# Patient Record
Sex: Male | Born: 1935 | Race: Black or African American | Hispanic: No | Smoking: Former smoker
Health system: Southern US, Community
[De-identification: ages and names within clinical notes are randomized; demographics above are authoritative.]

## PROBLEM LIST (undated history)

## (undated) DIAGNOSIS — E119 Type 2 diabetes mellitus without complications: Secondary | ICD-10-CM

## (undated) DIAGNOSIS — Z9889 Other specified postprocedural states: Secondary | ICD-10-CM

## (undated) DIAGNOSIS — K269 Duodenal ulcer, unspecified as acute or chronic, without hemorrhage or perforation: Secondary | ICD-10-CM

## (undated) HISTORY — DX: Other specified postprocedural states: Z98.890

## (undated) HISTORY — DX: Duodenal ulcer, unspecified as acute or chronic, without hemorrhage or perforation: K26.9

## (undated) HISTORY — DX: Type 2 diabetes mellitus without complications: E11.9

---

## 1994-02-27 DIAGNOSIS — Z9889 Other specified postprocedural states: Secondary | ICD-10-CM

## 1994-02-27 HISTORY — DX: Other specified postprocedural states: Z98.890

## 1998-05-20 ENCOUNTER — Encounter: Admission: RE | Admit: 1998-05-20 | Discharge: 1998-05-20 | Payer: Self-pay | Admitting: Internal Medicine

## 1998-10-15 ENCOUNTER — Encounter: Admission: RE | Admit: 1998-10-15 | Discharge: 1998-10-15 | Payer: Self-pay | Admitting: Internal Medicine

## 1998-12-03 ENCOUNTER — Encounter: Admission: RE | Admit: 1998-12-03 | Discharge: 1998-12-03 | Payer: Self-pay | Admitting: Internal Medicine

## 1999-01-13 ENCOUNTER — Other Ambulatory Visit: Admission: RE | Admit: 1999-01-13 | Discharge: 1999-01-13 | Payer: Self-pay | Admitting: Urology

## 1999-03-24 ENCOUNTER — Encounter: Admission: RE | Admit: 1999-03-24 | Discharge: 1999-03-24 | Payer: Self-pay | Admitting: Internal Medicine

## 1999-07-07 ENCOUNTER — Encounter: Admission: RE | Admit: 1999-07-07 | Discharge: 1999-07-07 | Payer: Self-pay | Admitting: Internal Medicine

## 1999-07-20 ENCOUNTER — Other Ambulatory Visit: Admission: RE | Admit: 1999-07-20 | Discharge: 1999-07-20 | Payer: Self-pay | Admitting: Urology

## 1999-07-20 ENCOUNTER — Encounter (INDEPENDENT_AMBULATORY_CARE_PROVIDER_SITE_OTHER): Payer: Self-pay | Admitting: Specialist

## 1999-08-05 ENCOUNTER — Encounter: Admission: RE | Admit: 1999-08-05 | Discharge: 1999-08-05 | Payer: Self-pay | Admitting: Internal Medicine

## 1999-09-08 ENCOUNTER — Encounter: Admission: RE | Admit: 1999-09-08 | Discharge: 1999-09-08 | Payer: Self-pay | Admitting: Internal Medicine

## 1999-12-08 ENCOUNTER — Encounter: Admission: RE | Admit: 1999-12-08 | Discharge: 1999-12-08 | Payer: Self-pay | Admitting: Internal Medicine

## 2000-02-23 ENCOUNTER — Encounter: Admission: RE | Admit: 2000-02-23 | Discharge: 2000-02-23 | Payer: Self-pay | Admitting: Internal Medicine

## 2000-04-26 ENCOUNTER — Encounter: Admission: RE | Admit: 2000-04-26 | Discharge: 2000-04-26 | Payer: Self-pay | Admitting: Internal Medicine

## 2000-06-07 ENCOUNTER — Encounter: Admission: RE | Admit: 2000-06-07 | Discharge: 2000-06-07 | Payer: Self-pay | Admitting: Internal Medicine

## 2000-06-08 ENCOUNTER — Encounter: Admission: RE | Admit: 2000-06-08 | Discharge: 2000-06-08 | Payer: Self-pay | Admitting: Internal Medicine

## 2000-09-13 ENCOUNTER — Encounter: Admission: RE | Admit: 2000-09-13 | Discharge: 2000-09-13 | Payer: Self-pay | Admitting: Internal Medicine

## 2000-12-06 ENCOUNTER — Encounter: Admission: RE | Admit: 2000-12-06 | Discharge: 2000-12-06 | Payer: Self-pay | Admitting: Internal Medicine

## 2001-02-28 ENCOUNTER — Encounter: Admission: RE | Admit: 2001-02-28 | Discharge: 2001-02-28 | Payer: Self-pay | Admitting: Internal Medicine

## 2001-05-16 ENCOUNTER — Encounter: Admission: RE | Admit: 2001-05-16 | Discharge: 2001-05-16 | Payer: Self-pay | Admitting: Internal Medicine

## 2001-09-12 ENCOUNTER — Encounter: Admission: RE | Admit: 2001-09-12 | Discharge: 2001-09-12 | Payer: Self-pay | Admitting: Internal Medicine

## 2002-02-13 ENCOUNTER — Encounter: Admission: RE | Admit: 2002-02-13 | Discharge: 2002-02-13 | Payer: Self-pay | Admitting: Internal Medicine

## 2002-05-02 ENCOUNTER — Ambulatory Visit (HOSPITAL_COMMUNITY): Admission: RE | Admit: 2002-05-02 | Discharge: 2002-05-02 | Payer: Self-pay | Admitting: Gastroenterology

## 2002-05-15 ENCOUNTER — Encounter: Admission: RE | Admit: 2002-05-15 | Discharge: 2002-05-15 | Payer: Self-pay | Admitting: Internal Medicine

## 2002-06-10 ENCOUNTER — Encounter: Payer: Self-pay | Admitting: Emergency Medicine

## 2002-06-10 ENCOUNTER — Emergency Department (HOSPITAL_COMMUNITY): Admission: EM | Admit: 2002-06-10 | Discharge: 2002-06-10 | Payer: Self-pay | Admitting: Emergency Medicine

## 2002-07-17 ENCOUNTER — Encounter: Admission: RE | Admit: 2002-07-17 | Discharge: 2002-07-17 | Payer: Self-pay | Admitting: Internal Medicine

## 2002-07-19 ENCOUNTER — Ambulatory Visit (HOSPITAL_COMMUNITY): Admission: RE | Admit: 2002-07-19 | Discharge: 2002-07-19 | Payer: Self-pay | Admitting: Internal Medicine

## 2002-07-19 ENCOUNTER — Encounter: Payer: Self-pay | Admitting: Internal Medicine

## 2002-08-21 ENCOUNTER — Encounter: Payer: Self-pay | Admitting: Internal Medicine

## 2002-08-21 ENCOUNTER — Encounter: Admission: RE | Admit: 2002-08-21 | Discharge: 2002-08-21 | Payer: Self-pay | Admitting: Internal Medicine

## 2002-08-21 ENCOUNTER — Ambulatory Visit (HOSPITAL_COMMUNITY): Admission: RE | Admit: 2002-08-21 | Discharge: 2002-08-21 | Payer: Self-pay | Admitting: Internal Medicine

## 2002-09-19 ENCOUNTER — Encounter: Admission: RE | Admit: 2002-09-19 | Discharge: 2002-09-19 | Payer: Self-pay | Admitting: Internal Medicine

## 2002-11-13 ENCOUNTER — Encounter: Admission: RE | Admit: 2002-11-13 | Discharge: 2002-11-13 | Payer: Self-pay | Admitting: Internal Medicine

## 2003-02-12 ENCOUNTER — Encounter: Admission: RE | Admit: 2003-02-12 | Discharge: 2003-02-12 | Payer: Self-pay | Admitting: Internal Medicine

## 2003-02-13 ENCOUNTER — Encounter: Admission: RE | Admit: 2003-02-13 | Discharge: 2003-02-13 | Payer: Self-pay | Admitting: Internal Medicine

## 2003-05-14 ENCOUNTER — Encounter: Admission: RE | Admit: 2003-05-14 | Discharge: 2003-05-14 | Payer: Self-pay | Admitting: Internal Medicine

## 2003-08-20 ENCOUNTER — Encounter: Admission: RE | Admit: 2003-08-20 | Discharge: 2003-08-20 | Payer: Self-pay | Admitting: Internal Medicine

## 2003-11-12 ENCOUNTER — Encounter: Admission: RE | Admit: 2003-11-12 | Discharge: 2003-11-12 | Payer: Self-pay | Admitting: Internal Medicine

## 2004-02-11 ENCOUNTER — Encounter: Admission: RE | Admit: 2004-02-11 | Discharge: 2004-02-11 | Payer: Self-pay | Admitting: Internal Medicine

## 2004-07-07 ENCOUNTER — Encounter: Admission: RE | Admit: 2004-07-07 | Discharge: 2004-07-07 | Payer: Self-pay | Admitting: Internal Medicine

## 2004-10-27 ENCOUNTER — Ambulatory Visit: Payer: Self-pay | Admitting: Internal Medicine

## 2005-01-05 ENCOUNTER — Ambulatory Visit: Payer: Self-pay | Admitting: Internal Medicine

## 2005-01-11 ENCOUNTER — Ambulatory Visit: Payer: Self-pay | Admitting: Internal Medicine

## 2005-04-20 ENCOUNTER — Ambulatory Visit: Payer: Self-pay | Admitting: Internal Medicine

## 2005-04-29 ENCOUNTER — Ambulatory Visit (HOSPITAL_COMMUNITY): Admission: RE | Admit: 2005-04-29 | Discharge: 2005-04-29 | Payer: Self-pay | Admitting: Internal Medicine

## 2005-05-18 ENCOUNTER — Ambulatory Visit: Payer: Self-pay | Admitting: Internal Medicine

## 2005-06-22 ENCOUNTER — Ambulatory Visit: Payer: Self-pay | Admitting: Internal Medicine

## 2005-08-17 ENCOUNTER — Ambulatory Visit: Payer: Self-pay | Admitting: Internal Medicine

## 2005-08-18 ENCOUNTER — Ambulatory Visit: Payer: Self-pay | Admitting: Internal Medicine

## 2005-08-18 ENCOUNTER — Ambulatory Visit (HOSPITAL_COMMUNITY): Admission: RE | Admit: 2005-08-18 | Discharge: 2005-08-18 | Payer: Self-pay | Admitting: Internal Medicine

## 2005-11-02 ENCOUNTER — Ambulatory Visit: Payer: Self-pay | Admitting: Internal Medicine

## 2006-02-01 ENCOUNTER — Ambulatory Visit: Payer: Self-pay | Admitting: Internal Medicine

## 2006-02-08 ENCOUNTER — Ambulatory Visit: Payer: Self-pay | Admitting: Hospitalist

## 2006-03-17 ENCOUNTER — Encounter (INDEPENDENT_AMBULATORY_CARE_PROVIDER_SITE_OTHER): Payer: Self-pay | Admitting: Internal Medicine

## 2006-07-13 ENCOUNTER — Encounter (INDEPENDENT_AMBULATORY_CARE_PROVIDER_SITE_OTHER): Payer: Self-pay | Admitting: Internal Medicine

## 2006-09-15 ENCOUNTER — Ambulatory Visit: Payer: Self-pay | Admitting: Internal Medicine

## 2006-09-15 ENCOUNTER — Encounter (INDEPENDENT_AMBULATORY_CARE_PROVIDER_SITE_OTHER): Payer: Self-pay | Admitting: Internal Medicine

## 2006-09-15 LAB — CONVERTED CEMR LAB
AST: 14 units/L (ref 0–37)
Albumin: 4.1 g/dL (ref 3.5–5.2)
Alkaline Phosphatase: 90 units/L (ref 39–117)
BUN: 24 mg/dL — ABNORMAL HIGH (ref 6–23)
CO2: 24 meq/L (ref 19–32)
Calcium: 9.6 mg/dL (ref 8.4–10.5)
Creatinine, Ser: 1.15 mg/dL (ref 0.40–1.50)
Glucose, Bld: 91 mg/dL (ref 70–99)
Sodium: 136 meq/L (ref 135–145)
Total Bilirubin: 0.6 mg/dL (ref 0.3–1.2)
Total CHOL/HDL Ratio: 2.1
Total Protein: 7.1 g/dL (ref 6.0–8.3)

## 2006-12-08 DIAGNOSIS — D649 Anemia, unspecified: Secondary | ICD-10-CM

## 2006-12-08 DIAGNOSIS — I10 Essential (primary) hypertension: Secondary | ICD-10-CM

## 2006-12-08 DIAGNOSIS — E8809 Other disorders of plasma-protein metabolism, not elsewhere classified: Secondary | ICD-10-CM | POA: Insufficient documentation

## 2006-12-08 DIAGNOSIS — R634 Abnormal weight loss: Secondary | ICD-10-CM

## 2006-12-08 DIAGNOSIS — K269 Duodenal ulcer, unspecified as acute or chronic, without hemorrhage or perforation: Secondary | ICD-10-CM

## 2006-12-08 DIAGNOSIS — E785 Hyperlipidemia, unspecified: Secondary | ICD-10-CM

## 2006-12-08 DIAGNOSIS — E119 Type 2 diabetes mellitus without complications: Secondary | ICD-10-CM

## 2006-12-08 DIAGNOSIS — L910 Hypertrophic scar: Secondary | ICD-10-CM

## 2006-12-08 HISTORY — DX: Duodenal ulcer, unspecified as acute or chronic, without hemorrhage or perforation: K26.9

## 2006-12-08 HISTORY — DX: Type 2 diabetes mellitus without complications: E11.9

## 2006-12-12 DIAGNOSIS — R948 Abnormal results of function studies of other organs and systems: Secondary | ICD-10-CM

## 2006-12-12 DIAGNOSIS — Z87898 Personal history of other specified conditions: Secondary | ICD-10-CM

## 2006-12-19 ENCOUNTER — Telehealth: Payer: Self-pay | Admitting: *Deleted

## 2007-02-06 ENCOUNTER — Telehealth: Payer: Self-pay | Admitting: *Deleted

## 2007-03-07 ENCOUNTER — Encounter (INDEPENDENT_AMBULATORY_CARE_PROVIDER_SITE_OTHER): Payer: Self-pay | Admitting: Internal Medicine

## 2007-03-10 ENCOUNTER — Telehealth: Payer: Self-pay | Admitting: *Deleted

## 2007-03-24 ENCOUNTER — Telehealth: Payer: Self-pay | Admitting: *Deleted

## 2007-04-18 ENCOUNTER — Ambulatory Visit: Payer: Self-pay | Admitting: Internal Medicine

## 2007-04-18 DIAGNOSIS — R413 Other amnesia: Secondary | ICD-10-CM | POA: Insufficient documentation

## 2007-04-18 LAB — CONVERTED CEMR LAB: Hgb A1c MFr Bld: 5.9 %

## 2007-05-16 ENCOUNTER — Ambulatory Visit: Payer: Self-pay | Admitting: Internal Medicine

## 2007-05-18 ENCOUNTER — Telehealth: Payer: Self-pay | Admitting: *Deleted

## 2007-05-30 LAB — CONVERTED CEMR LAB
HDL: 74 mg/dL (ref >39)
Triglycerides: 37 mg/dL (ref <150)

## 2007-08-08 ENCOUNTER — Telehealth: Payer: Self-pay | Admitting: *Deleted

## 2007-10-10 ENCOUNTER — Ambulatory Visit: Payer: Self-pay | Admitting: Internal Medicine

## 2007-10-10 DIAGNOSIS — R0989 Other specified symptoms and signs involving the circulatory and respiratory systems: Secondary | ICD-10-CM

## 2007-10-10 DIAGNOSIS — G609 Hereditary and idiopathic neuropathy, unspecified: Secondary | ICD-10-CM | POA: Insufficient documentation

## 2007-10-10 LAB — CONVERTED CEMR LAB
Calcium: 9.6 mg/dL (ref 8.4–10.5)
Creatinine, Ser: 1.38 mg/dL (ref 0.40–1.50)
HCT: 32.4 % — ABNORMAL LOW (ref 39.0–52.0)
Hgb A1c MFr Bld: 6 %
Potassium: 4.1 meq/L (ref 3.5–5.3)
Sodium: 140 meq/L (ref 135–145)
Vitamin B-12: 428 pg/mL (ref 211–911)
WBC: 4.2 10*3/uL (ref 4.0–10.5)

## 2007-10-12 ENCOUNTER — Ambulatory Visit (HOSPITAL_COMMUNITY): Admission: RE | Admit: 2007-10-12 | Discharge: 2007-10-12 | Payer: Self-pay | Admitting: Internal Medicine

## 2007-12-06 ENCOUNTER — Telehealth (INDEPENDENT_AMBULATORY_CARE_PROVIDER_SITE_OTHER): Payer: Self-pay | Admitting: Pharmacy Technician

## 2011-04-16 NOTE — Procedures (Signed)
[place]. [facility]  Patient:    Timothy Mayo, Timothy Mayo Visit Number: [id number] MRN: [id number]          Service Type: END Location: ENDO Attending Physician:  Charna Elizabeth Dictated by:   Anselmo Rod, M.D. Proc. Date: 05/02/02 Admit Date:  05/02/2002   CC:         Alvester Morin, M.D.   Procedure Report  DATE OF BIRTH:  [birth year].  PROCEDURE:  Screening colonoscopy.  ENDOSCOPIST:  Anselmo Rod, M.D.  INSTRUMENT USED:  Olympus video colonoscope.  INDICATION FOR PROCEDURE:  A 77 year old African-American male with history of diabetes, undergoing screening colonoscopy.  Rule out colonic polyps, masses, hemorrhoids, etc.  PREPROCEDURE PREPARATION:  Informed consent was procured from the patient. The patient was fasted for eight hours prior to the procedure and prepped with a bottle of magnesium citrate and a gallon of NuLytely the night prior to the procedure.  PREPROCEDURE PHYSICAL:  VITAL SIGNS:  The patient had stable vital signs.  NECK:  Supple.  CHEST:  Clear to auscultation.  S1, S2 regular.  ABDOMEN:  Soft with normal bowel sounds.  DESCRIPTION OF PROCEDURE:  The patient was placed in the left lateral decubitus position and sedated with 70 mg of Demerol and 6 mg of Versed intravenously.  Once the patient was adequately sedate and maintained on low-flow oxygen and continuous cardiac monitoring, the Olympus video colonoscope was advanced from the rectum to the cecum with difficulty.  There was a large amount of residual stool in the right colon and the cecum. Multiple washes were done.  Two undigested pills were also seen in the cecum. The appendiceal orifice and the ileocecal valve were clearly visualized and photographed.  The colonic mucosa appeared healthy, without lesions.  No masses, polyps, erosions, ulcerations, or diverticula were present.  Small, nonbleeding internal hemorrhoids were seen on retroflexion in the  rectum. There was a small patch of erythema in the rectum, which may have represented an AVM.  For obvious reasons biopsies were not done.  This was not bleeding and therefore was not ablated.  The patient tolerated the procedure well without complications.  IMPRESSION: 1. Small, nonbleeding internal hemorrhoid. 2. Small erythematous area in the rectum, question arteriovenous malformation. 3. No masses, polyps, or diverticulosis noted. 4. Large amount of residual stool in the right colon, small lesions could    have been missed.  RECOMMENDATIONS: 1. A high-fiber diet has been recommended for the patient. 2. Repeat colorectal cancer screening is recommended in the next 10 years    unless the patient develops any abnormal symptoms in the interim. 3. Outpatient follow-up as need arises. Dictated by:   Anselmo Rod, M.D. Attending Physician:  Charna Elizabeth DD:  05/02/02 TD:  05/03/02 Job: [id number]

## 2011-11-02 ENCOUNTER — Ambulatory Visit (HOSPITAL_COMMUNITY)
Admission: RE | Admit: 2011-11-02 | Discharge: 2011-11-02 | Disposition: A | Discharge status: Home / Self Care | Payer: PRIVATE HEALTH INSURANCE | Source: Ambulatory Visit | Attending: Family Medicine | Admitting: Family Medicine

## 2011-11-02 ENCOUNTER — Encounter: Payer: Self-pay | Admitting: Family Medicine

## 2011-11-02 ENCOUNTER — Ambulatory Visit (INDEPENDENT_AMBULATORY_CARE_PROVIDER_SITE_OTHER): Payer: PRIVATE HEALTH INSURANCE | Admitting: Family Medicine

## 2011-11-02 VITALS — BP 212/100 | HR 80 | Temp 98.2°F | Wt 140.3 lb

## 2011-11-02 DIAGNOSIS — R9431 Abnormal electrocardiogram [ECG] [EKG]: Secondary | ICD-10-CM | POA: Insufficient documentation

## 2011-11-02 DIAGNOSIS — I10 Essential (primary) hypertension: Secondary | ICD-10-CM | POA: Insufficient documentation

## 2011-11-02 DIAGNOSIS — E119 Type 2 diabetes mellitus without complications: Secondary | ICD-10-CM

## 2011-11-02 DIAGNOSIS — E785 Hyperlipidemia, unspecified: Secondary | ICD-10-CM

## 2011-11-02 DIAGNOSIS — D649 Anemia, unspecified: Secondary | ICD-10-CM

## 2011-11-02 LAB — CBC WITH DIFFERENTIAL/PLATELET
Basophils Absolute: 0 10*3/uL (ref 0.0–0.1)
Basophils Relative: 0 % (ref 0–1)
Eosinophils Absolute: 0.1 10*3/uL (ref 0.0–0.7)
Eosinophils Relative: 3 % (ref 0–5)
HCT: 33 % — ABNORMAL LOW (ref 39.0–52.0)
Hemoglobin: 10.6 g/dL — ABNORMAL LOW (ref 13.0–17.0)
Lymphocytes Relative: 20 % (ref 12–46)
Lymphs Abs: 0.7 10*3/uL (ref 0.7–4.0)
MCH: 25.8 pg — ABNORMAL LOW (ref 26.0–34.0)
MCHC: 32.1 g/dL (ref 30.0–36.0)
MCV: 80.3 fL (ref 78.0–100.0)
Monocytes Absolute: 0.2 10*3/uL (ref 0.1–1.0)
Monocytes Relative: 5 % (ref 3–12)
Neutro Abs: 2.7 10*3/uL (ref 1.7–7.7)
Neutrophils Relative %: 72 % (ref 43–77)
Platelets: 280 10*3/uL (ref 150–400)
RBC: 4.11 MIL/uL — ABNORMAL LOW (ref 4.22–5.81)
RDW: 17.2 % — ABNORMAL HIGH (ref 11.5–15.5)
WBC: 3.7 10*3/uL — ABNORMAL LOW (ref 4.0–10.5)

## 2011-11-02 LAB — TSH: TSH: 4.613 u[IU]/mL — ABNORMAL HIGH (ref 0.350–4.500)

## 2011-11-02 LAB — POCT GLYCOSYLATED HEMOGLOBIN (HGB A1C): Hemoglobin A1C: 5.9

## 2011-11-02 LAB — COMPREHENSIVE METABOLIC PANEL
Albumin: 3.9 g/dL (ref 3.5–5.2)
BUN: 19 mg/dL (ref 6–23)
Creat: 1.42 mg/dL — ABNORMAL HIGH (ref 0.50–1.35)
Glucose, Bld: 118 mg/dL — ABNORMAL HIGH (ref 70–99)
Sodium: 135 mEq/L (ref 135–145)

## 2011-11-02 MED ORDER — NIFEDIPINE ER OSMOTIC RELEASE 30 MG PO TB24: 30.0000 mg | ORAL_TABLET | Freq: Every day | ORAL | Status: DC

## 2011-11-02 MED ORDER — HYDROCHLOROTHIAZIDE 25 MG PO TABS: 25.0000 mg | ORAL_TABLET | Freq: Every day | ORAL | Status: DC

## 2011-11-02 NOTE — Assessment & Plan Note (Signed)
Obtain lipids today to determine need for medication.

## 2011-11-02 NOTE — Assessment & Plan Note (Signed)
Will begin treatment with combination CCB and thiazide.  No evidence of acute MI on EKG.  Will not use bb at this time due to borderline bradycardia.  F/u 1 week.

## 2011-11-02 NOTE — Patient Instructions (Signed)
It was good to see you today! I want you to start on two blood pressure medications and come back to see me in 1 week. If you develop any chest pain, changes in your vision, shortness of breath, severe headaches, or pass out, you need to go to the emergency room immediately. We will also obtain some blood work to check you out.  We will discuss this when you come back next week.

## 2011-11-02 NOTE — Assessment & Plan Note (Signed)
Obtain A1c today to determine extent of disease

## 2011-11-02 NOTE — Assessment & Plan Note (Signed)
CBC today.  

## 2011-11-02 NOTE — Progress Notes (Signed)
Subjective: The patient presents today for an initial appointment. He has not been seen by a physician in a number of years. He presents today with his 2 daughters who are the ones who are insisting that he see a doctor. They have no specific concerns, however they want him to have a checkup.  The patient has known high blood pressure and diabetes. He stopped taking medications for these a number of years ago. He reports no problems with headaches, visual changes, chest pain, shortness of breath, dyspnea on exertion, abdominal pain, constipation, diarrhea, urinary incontinence, urinary retention, lower extremity edema, or palpitations.  Patient's past medical, surgical, and social history were reviewed and updated as appropriate.  Review of systems: 12 point review of systems was performed. Negative except as noted in the history of present illness.  Allergies reviewed  Medications reviewed  Objective:  Filed Vitals:   11/02/11 0859  BP: 212/100  Pulse: 80  Temp: 98.2 F (36.8 C)   Gen: No acute distress HEENT: Pupils equal, round, reactive to light, there is mild clouding of the lenses bilaterally. No AV nicking. Left tympanic membrane normal. Right tympanic membrane obscured by wax. No thyromegaly. CV: Mildly bradycardic. Occasionally skips a beat. Otherwise regular with no appreciable murmurs. Resp: Clear to auscultation bilaterally. No wheezes or crackles. Abd: Soft, nontender, nondistended. No organomegaly. Extremities: 2+ pulses, no edema. Neuro: Alert and oriented x3, reflexes 2+ bilaterally, 5+ strength bilaterally.  Assessment/Plan: Obtain basic labs, EKG unremarkable, start antihypertensives and return to clinic one week.  Please also see individual problems in problem list for problem-specific plans.

## 2011-11-12 ENCOUNTER — Encounter: Payer: Self-pay | Admitting: Family Medicine

## 2011-11-12 ENCOUNTER — Ambulatory Visit (INDEPENDENT_AMBULATORY_CARE_PROVIDER_SITE_OTHER): Payer: PRIVATE HEALTH INSURANCE | Admitting: Family Medicine

## 2011-11-12 VITALS — BP 178/76 | HR 96 | Temp 97.6°F | Ht 70.25 in | Wt 132.0 lb

## 2011-11-12 DIAGNOSIS — E039 Hypothyroidism, unspecified: Secondary | ICD-10-CM

## 2011-11-12 DIAGNOSIS — I10 Essential (primary) hypertension: Secondary | ICD-10-CM

## 2011-11-12 DIAGNOSIS — E785 Hyperlipidemia, unspecified: Secondary | ICD-10-CM

## 2011-11-12 MED ORDER — HYDROCHLOROTHIAZIDE 25 MG PO TABS: 25.0000 mg | ORAL_TABLET | Freq: Every day | ORAL | Status: DC

## 2011-11-12 MED ORDER — LEVOTHYROXINE SODIUM 25 MCG PO TABS: 25.0000 ug | ORAL_TABLET | Freq: Every day | ORAL | Status: DC

## 2011-11-12 MED ORDER — NIFEDIPINE ER 60 MG PO TB24: 60.0000 mg | ORAL_TABLET | Freq: Every day | ORAL | Status: DC

## 2011-11-12 NOTE — Assessment & Plan Note (Signed)
Hold off on tx for now per daughter request.  Do not feel strongly about this.

## 2011-11-12 NOTE — Progress Notes (Signed)
Subjective: The patient presents today for blood pressure followup. He's been taking both blood pressure medicines with no side effects. He has not had any syncope, headaches, visual changes, chest pain, or shortness of breath. He has no complaints. He is accompanied today by his daughter.  Objective:  Filed Vitals:   11/12/11 1031  BP: 178/76  Pulse: 96  Temp: 97.6 F (36.4 C)   Gen: No acute distress CV: The patient is overall regular with occasional dropped beats. This is consistent with his previous exam. There are no murmurs detected. PMI is mildly hyperdynamic. Resp: Clear to auscultation bilaterally Extremities: 2+ pulses, no edema  Assessment/Plan: I discussed the patient's lab findings in great detail with him and his daughter. We have decided to hold off on any medication for his cholesterol at this time. We will increase the dose of his nifedipine, and will also start him on a low dose of Synthroid.  Please also see individual problems in problem list for problem-specific plans.

## 2011-11-12 NOTE — Assessment & Plan Note (Signed)
Start low dose synthroid, recheck 1-2 months.

## 2011-11-12 NOTE — Patient Instructions (Signed)
It was great to see you today! We are starting you on some thyroid medicine today.  We will recheck your thyroid in about 2 months. When you run out of your current nifedipine, I want you to start taking 60mg  tablets daily.  These have all ready been sent in to your pharmacy. Come back to see my after the holidays.

## 2011-11-12 NOTE — Assessment & Plan Note (Signed)
Increase nifedipine to 60, recheck in 3-4 weeks.

## 2011-12-16 ENCOUNTER — Ambulatory Visit: Payer: PRIVATE HEALTH INSURANCE | Admitting: Family Medicine

## 2011-12-17 ENCOUNTER — Ambulatory Visit: Payer: PRIVATE HEALTH INSURANCE | Admitting: Family Medicine

## 2012-01-12 ENCOUNTER — Ambulatory Visit: Payer: PRIVATE HEALTH INSURANCE | Admitting: Family Medicine

## 2012-02-09 ENCOUNTER — Ambulatory Visit (INDEPENDENT_AMBULATORY_CARE_PROVIDER_SITE_OTHER): Payer: PRIVATE HEALTH INSURANCE | Admitting: Family Medicine

## 2012-02-09 VITALS — BP 158/100 | HR 54 | Temp 98.3°F | Ht 70.25 in | Wt 135.0 lb

## 2012-02-09 DIAGNOSIS — E039 Hypothyroidism, unspecified: Secondary | ICD-10-CM

## 2012-02-09 DIAGNOSIS — I10 Essential (primary) hypertension: Secondary | ICD-10-CM

## 2012-02-09 LAB — BASIC METABOLIC PANEL
BUN: 19 mg/dL (ref 6–23)
Chloride: 100 mEq/L (ref 96–112)
Creat: 1.36 mg/dL — ABNORMAL HIGH (ref 0.50–1.35)
Glucose, Bld: 122 mg/dL — ABNORMAL HIGH (ref 70–99)

## 2012-02-09 MED ORDER — NIFEDIPINE ER 90 MG PO TB24: 90.0000 mg | ORAL_TABLET | Freq: Every day | ORAL | Status: DC

## 2012-02-09 NOTE — Patient Instructions (Signed)
It was great to see you today! I have sent in a prescription for your Procardia. He will now be taking 90 mg every day. Please come back to see me in 1-2 months to check on your blood pressure again.

## 2012-02-10 LAB — TSH: TSH: 5.52 u[IU]/mL — ABNORMAL HIGH (ref 0.350–4.500)

## 2012-02-14 NOTE — Progress Notes (Signed)
Subjective: The patient is a 78 y.o. year old male who presents today for followup.  1 hypothyroid: The patient states that he has been taking his medicine. His energy level is normal. He has a good appetite. No changes in weight. No hair changes. No heat or cold intolerance.  2 hypertension: Patient has been taking his medication for this condition as well. No headaches, chest pain, shortness of breath, visual changes, or lower extremity edema.  Objective:  Filed Vitals:   02/09/12 1121  BP: 158/100  Pulse: 54  Temp: 98.3 F (36.8 C)   Gen: No acute distress CV: Regular rate and rhythm Resp: Clear to auscultation bilaterally Ext: No edema  Assessment/Plan:  Please also see individual problems in problem list for problem-specific plans.

## 2012-02-14 NOTE — Assessment & Plan Note (Signed)
No concerning symptoms. Tolerating medications well. Had a long discussion with the patient and his daughter about titrating up blood pressure medicines versus adding an ACE inhibitor. It was decided that getting the patient to followup for blood work required for starting an ACE inhibitor would likely not happen. Accordingly we will increase his nifedipine and see him back in approximately one month.

## 2012-02-14 NOTE — Assessment & Plan Note (Signed)
Recheck TSH today. No overt symptoms of hypo-or hyperthyroid.

## 2012-06-02 ENCOUNTER — Ambulatory Visit: Payer: PRIVATE HEALTH INSURANCE | Admitting: Family Medicine

## 2014-07-02 ENCOUNTER — Telehealth: Payer: Self-pay | Admitting: Clinical

## 2014-07-02 ENCOUNTER — Encounter: Payer: PRIVATE HEALTH INSURANCE | Admitting: Family Medicine

## 2014-07-02 NOTE — Telephone Encounter (Signed)
Pts daughter called inquiring as to whether pt could have a doctor visit pt at home for appts's vs pt coming in. Daughter states pt is stubborn and does not like coming to the clinic. CSW informed daughter that CSW would fwd request to PCP. CSW also encouraged daughter to contact pt's insurance to explore whether there are other resources that pt's insurance qualifies for as daughter states her aunt receives home visits from her PCP.  Theresia Bough, MSW, LCSW [phone]

## 2014-07-02 NOTE — Telephone Encounter (Signed)
error 

## 2014-07-08 NOTE — Telephone Encounter (Signed)
Unfortunately, I don't think we have time to make home visits routinely. Stubbornness is not an indication for a home visit anyway. But I'd be glad to see them in clinic or you could see if the geriatrics resident could do a home visit, as I believe that is a component of that rotation.

## 2014-07-16 ENCOUNTER — Ambulatory Visit (INDEPENDENT_AMBULATORY_CARE_PROVIDER_SITE_OTHER): Payer: PRIVATE HEALTH INSURANCE | Admitting: Family Medicine

## 2014-07-16 ENCOUNTER — Encounter: Payer: Self-pay | Admitting: Family Medicine

## 2014-07-16 VITALS — BP 155/73 | HR 79 | Temp 98.1°F | Ht 70.5 in | Wt 123.0 lb

## 2014-07-16 DIAGNOSIS — I10 Essential (primary) hypertension: Secondary | ICD-10-CM

## 2014-07-16 DIAGNOSIS — R948 Abnormal results of function studies of other organs and systems: Secondary | ICD-10-CM

## 2014-07-16 DIAGNOSIS — E039 Hypothyroidism, unspecified: Secondary | ICD-10-CM

## 2014-07-16 DIAGNOSIS — E785 Hyperlipidemia, unspecified: Secondary | ICD-10-CM

## 2014-07-16 LAB — POCT URINALYSIS DIPSTICK
Bilirubin, UA: NEGATIVE
Glucose, UA: NEGATIVE
Ketones, UA: NEGATIVE
LEUKOCYTES UA: NEGATIVE
Nitrite, UA: NEGATIVE
PH UA: 6
Protein, UA: 100
Spec Grav, UA: >=1.03
UROBILINOGEN UA: 0.2

## 2014-07-16 LAB — POCT UA - MICROSCOPIC ONLY

## 2014-07-16 MED ORDER — NIFEDIPINE ER 90 MG PO TB24: 90.0000 mg | ORAL_TABLET | Freq: Every day | ORAL | Status: DC

## 2014-07-16 MED ORDER — LEVOTHYROXINE SODIUM 25 MCG PO TABS: 25.0000 ug | ORAL_TABLET | Freq: Every day | ORAL | Status: DC

## 2014-07-16 MED ORDER — HYDROCHLOROTHIAZIDE 25 MG PO TABS: 25.0000 mg | ORAL_TABLET | Freq: Every day | ORAL | Status: DC

## 2014-07-16 NOTE — Assessment & Plan Note (Signed)
Discussed this with pt and daughter. Any nodule as described in these scans is highly unlikely to be malignant given the lack of progressive symptoms to date. We have decided not to follow up scan unless symptoms arise.

## 2014-07-16 NOTE — Assessment & Plan Note (Signed)
Slightly high today, but has been out of medications. No symptoms. Will check renal function and restart previous medications, which were tolerated well. Rx 1 year worth of 90-day refills.

## 2014-07-16 NOTE — Assessment & Plan Note (Signed)
No recent symptoms despite not taking synthroid for months. Refilled previous low dose and will recheck TSH today.

## 2014-07-16 NOTE — Progress Notes (Signed)
Patient ID: Jaleek Leavins, male   DOB: 1925-07-15, 80 y.o.   MRN: [id number]   Subjective:   Gabriele Gower is a 80 y.o. male with a history of HTN and hypothyroidism here for follow up  Mr. Pregler lives with and is accopmanied by his daughter. He has no complaints and denies falling, depression, or any pain. Receives some help with ADLs but is able to bathe and toilet himself.  No fevers, chills, night sweats, HA, vision changes, hearing changes, CP, SOB, palpitations, leg swelling, PND, orthopnea, dysphagia, weight loss, abd pain, N/V/D/constipation/change in stool shape/caliber/blood in stool. No dysuria, urinary frequency. Has noted redness in his urine many months ago. No problem with urine stream. No arthralgias, myalgias, rash.   He was previously on HTN medications and synthroid but has not taken these in "a long time," since he ran out of refills.   Has a history of smoking, but quit >10 years ago.   Review of Systems:  Per HPI. All other systems reviewed and are negative. Objective:  BP 155/73  Pulse 79  Temp(Src) 98.1 F (36.7 C) (Oral)  Ht 5' 10.5" (1.791 m)  Wt 123 lb (55.792 kg)  BMI 17.39 kg/m2  Gen: Pleasant, thin 80 y.o. male in NAD HEENT: MMM, EOMI, PERRL, anicteric sclerae, thin cataracts bilaterally with arcus senilis CV: RRR, no MRG, no JVD, no LE edema.  Resp: Non-labored, CTAB, no wheezes noted Abd: Soft, NTND, BS present, no guarding or organomegaly Neuro: Alert and oriented, speech normal, gait normal.  Lab Results  Component Value Date   TSH 5.520* 02/09/2012   Assessment:     Brinden Levison is a 80 y.o. male here for follow up.    Plan:     See problem list for problem-specific plans.

## 2014-07-16 NOTE — Addendum Note (Signed)
Addended by: [place], Darvin Dials on: 07/16/2014 05:23 PM   Modules accepted: Orders

## 2014-07-16 NOTE — Assessment & Plan Note (Signed)
Will not recheck this today as statin benefit group only proven to age 78 in certain populations. Pt is by nature averse to medications and benefit is marginal at best.

## 2014-07-16 NOTE — Patient Instructions (Signed)
It was great to meet you and your daughter, Gus!  - We will get some bloodwork and a urine sample today. I will let you know if anything is abnormal.  - I have refilled your medicines and 1 year worth of refills.  - Please follow up with me in a year or so or sooner if anything comes up.  - Keep thinking about your advanced directives - it's better to know your wishes BEFORE it's a critical time than during it.   Take care! - Dr. Jarvis Newcomer

## 2014-07-17 LAB — COMPREHENSIVE METABOLIC PANEL
ALK PHOS: 81 U/L (ref 39–117)
AST: 18 U/L (ref 0–37)
Albumin: 4 g/dL (ref 3.5–5.2)
BILIRUBIN TOTAL: 0.7 mg/dL (ref 0.2–1.2)
BUN: 21 mg/dL (ref 6–23)
CO2: 26 mEq/L (ref 19–32)
Calcium: 9.5 mg/dL (ref 8.4–10.5)
Chloride: 101 mEq/L (ref 96–112)
Creat: 1.54 mg/dL — ABNORMAL HIGH (ref 0.50–1.35)
GLUCOSE: 103 mg/dL — AB (ref 70–99)
Potassium: 4.2 mEq/L (ref 3.5–5.3)
SODIUM: 137 meq/L (ref 135–145)
TOTAL PROTEIN: 6.7 g/dL (ref 6.0–8.3)

## 2014-07-17 LAB — CBC WITH DIFFERENTIAL/PLATELET
BASOS ABS: 0 10*3/uL (ref 0.0–0.1)
BASOS PCT: 0 % (ref 0–1)
EOS ABS: 0.1 10*3/uL (ref 0.0–0.7)
EOS PCT: 2 % (ref 0–5)
HEMATOCRIT: 36 % — AB (ref 39.0–52.0)
HEMOGLOBIN: 11.2 g/dL — AB (ref 13.0–17.0)
LYMPHS ABS: 1.9 10*3/uL (ref 0.7–4.0)
LYMPHS PCT: 55 % — AB (ref 12–46)
MCH: 23.8 pg — ABNORMAL LOW (ref 26.0–34.0)
MCHC: 31.1 g/dL (ref 30.0–36.0)
MCV: 76.6 fL — ABNORMAL LOW (ref 78.0–100.0)
MONO ABS: 0.1 10*3/uL (ref 0.1–1.0)
MONOS PCT: 4 % (ref 3–12)
NEUTROS PCT: 39 % — AB (ref 43–77)
Neutro Abs: 1.3 10*3/uL — ABNORMAL LOW (ref 1.7–7.7)
Platelets: 243 10*3/uL (ref 150–400)
RBC: 4.7 MIL/uL (ref 4.22–5.81)
RDW: 18.9 % — AB (ref 11.5–15.5)
WBC: 3.4 10*3/uL — AB (ref 4.0–10.5)

## 2014-07-17 LAB — TSH: TSH: 8.647 u[IU]/mL — ABNORMAL HIGH (ref 0.350–4.500)

## 2014-08-25 ENCOUNTER — Inpatient Hospital Stay (HOSPITAL_COMMUNITY)
Admission: EM | Admit: 2014-08-25 | Discharge: 2014-08-28 | DRG: 070 | Disposition: A | Discharge status: Home / Self Care | Payer: PRIVATE HEALTH INSURANCE | Attending: Family Medicine | Admitting: Family Medicine

## 2014-08-25 ENCOUNTER — Emergency Department (HOSPITAL_COMMUNITY): Payer: PRIVATE HEALTH INSURANCE

## 2014-08-25 ENCOUNTER — Encounter (HOSPITAL_COMMUNITY): Payer: Self-pay | Admitting: Emergency Medicine

## 2014-08-25 DIAGNOSIS — D649 Anemia, unspecified: Secondary | ICD-10-CM

## 2014-08-25 DIAGNOSIS — I672 Cerebral atherosclerosis: Secondary | ICD-10-CM | POA: Diagnosis present

## 2014-08-25 DIAGNOSIS — N189 Chronic kidney disease, unspecified: Secondary | ICD-10-CM | POA: Diagnosis present

## 2014-08-25 DIAGNOSIS — R410 Disorientation, unspecified: Secondary | ICD-10-CM

## 2014-08-25 DIAGNOSIS — D509 Iron deficiency anemia, unspecified: Secondary | ICD-10-CM | POA: Diagnosis present

## 2014-08-25 DIAGNOSIS — F29 Unspecified psychosis not due to a substance or known physiological condition: Secondary | ICD-10-CM | POA: Diagnosis not present

## 2014-08-25 DIAGNOSIS — R4182 Altered mental status, unspecified: Secondary | ICD-10-CM

## 2014-08-25 DIAGNOSIS — I10 Essential (primary) hypertension: Secondary | ICD-10-CM

## 2014-08-25 DIAGNOSIS — R636 Underweight: Secondary | ICD-10-CM | POA: Diagnosis present

## 2014-08-25 DIAGNOSIS — R531 Weakness: Secondary | ICD-10-CM

## 2014-08-25 DIAGNOSIS — Z87891 Personal history of nicotine dependence: Secondary | ICD-10-CM

## 2014-08-25 DIAGNOSIS — Z9119 Patient's noncompliance with other medical treatment and regimen: Secondary | ICD-10-CM

## 2014-08-25 DIAGNOSIS — I5020 Unspecified systolic (congestive) heart failure: Secondary | ICD-10-CM | POA: Diagnosis present

## 2014-08-25 DIAGNOSIS — I129 Hypertensive chronic kidney disease with stage 1 through stage 4 chronic kidney disease, or unspecified chronic kidney disease: Secondary | ICD-10-CM | POA: Diagnosis present

## 2014-08-25 DIAGNOSIS — G9340 Encephalopathy, unspecified: Secondary | ICD-10-CM | POA: Diagnosis not present

## 2014-08-25 DIAGNOSIS — E119 Type 2 diabetes mellitus without complications: Secondary | ICD-10-CM | POA: Diagnosis present

## 2014-08-25 DIAGNOSIS — E039 Hypothyroidism, unspecified: Secondary | ICD-10-CM

## 2014-08-25 DIAGNOSIS — N179 Acute kidney failure, unspecified: Secondary | ICD-10-CM | POA: Diagnosis present

## 2014-08-25 DIAGNOSIS — R64 Cachexia: Secondary | ICD-10-CM | POA: Diagnosis present

## 2014-08-25 DIAGNOSIS — N400 Enlarged prostate without lower urinary tract symptoms: Secondary | ICD-10-CM | POA: Diagnosis present

## 2014-08-25 DIAGNOSIS — F0150 Vascular dementia without behavioral disturbance: Secondary | ICD-10-CM | POA: Diagnosis present

## 2014-08-25 DIAGNOSIS — E46 Unspecified protein-calorie malnutrition: Secondary | ICD-10-CM | POA: Diagnosis present

## 2014-08-25 DIAGNOSIS — R413 Other amnesia: Secondary | ICD-10-CM

## 2014-08-25 DIAGNOSIS — E785 Hyperlipidemia, unspecified: Secondary | ICD-10-CM

## 2014-08-25 DIAGNOSIS — R748 Abnormal levels of other serum enzymes: Secondary | ICD-10-CM | POA: Diagnosis present

## 2014-08-25 DIAGNOSIS — Z681 Body mass index (BMI) 19 or less, adult: Secondary | ICD-10-CM

## 2014-08-25 DIAGNOSIS — Z91199 Patient's noncompliance with other medical treatment and regimen due to unspecified reason: Secondary | ICD-10-CM

## 2014-08-25 DIAGNOSIS — I420 Dilated cardiomyopathy: Secondary | ICD-10-CM

## 2014-08-25 DIAGNOSIS — E43 Unspecified severe protein-calorie malnutrition: Secondary | ICD-10-CM

## 2014-08-25 DIAGNOSIS — R778 Other specified abnormalities of plasma proteins: Secondary | ICD-10-CM | POA: Diagnosis present

## 2014-08-25 DIAGNOSIS — R7989 Other specified abnormal findings of blood chemistry: Secondary | ICD-10-CM

## 2014-08-25 DIAGNOSIS — I509 Heart failure, unspecified: Secondary | ICD-10-CM | POA: Diagnosis present

## 2014-08-25 DIAGNOSIS — R948 Abnormal results of function studies of other organs and systems: Secondary | ICD-10-CM

## 2014-08-25 DIAGNOSIS — R55 Syncope and collapse: Secondary | ICD-10-CM

## 2014-08-25 LAB — CBC WITH DIFFERENTIAL/PLATELET
BASOS ABS: 0 10*3/uL (ref 0.0–0.1)
BASOS PCT: 0 % (ref 0–1)
EOS PCT: 0 % (ref 0–5)
Eosinophils Absolute: 0 10*3/uL (ref 0.0–0.7)
HEMATOCRIT: 33.6 % — AB (ref 39.0–52.0)
Hemoglobin: 10.7 g/dL — ABNORMAL LOW (ref 13.0–17.0)
Lymphocytes Relative: 28 % (ref 12–46)
Lymphs Abs: 1 10*3/uL (ref 0.7–4.0)
MCH: 23.8 pg — AB (ref 26.0–34.0)
MCHC: 31.8 g/dL (ref 30.0–36.0)
MCV: 74.8 fL — ABNORMAL LOW (ref 78.0–100.0)
MONO ABS: 0.1 10*3/uL (ref 0.1–1.0)
Monocytes Relative: 4 % (ref 3–12)
NEUTROS ABS: 2.4 10*3/uL (ref 1.7–7.7)
Neutrophils Relative %: 68 % (ref 43–77)
Platelets: 249 10*3/uL (ref 150–400)
RBC: 4.49 MIL/uL (ref 4.22–5.81)
RDW: 18.1 % — ABNORMAL HIGH (ref 11.5–15.5)
WBC: 3.6 10*3/uL — AB (ref 4.0–10.5)

## 2014-08-25 LAB — I-STAT CG4 LACTIC ACID, ED: LACTIC ACID, VENOUS: 1.93 mmol/L (ref 0.5–2.2)

## 2014-08-25 NOTE — ED Notes (Signed)
Returned from CT.

## 2014-08-25 NOTE — ED Provider Notes (Signed)
CSN: [id number]     Arrival date & time 08/25/14  2308 History   First MD Initiated Contact with Patient 08/25/14 2309     Chief Complaint  Patient presents with  . Loss of Consciousness     (Consider location/radiation/quality/duration/timing/severity/associated sxs/prior Treatment) HPI 80 yo male presents to the ER from home via EMS after syncopal episode.  Pt reportedly collapsed in front of family members, no seizure activity.  EMS reports history of seizures, but no history of this in the chart.  Pt had some right sided weakness initially with EMS which resolved upon re-evaluation in the EMS truck.  Pt with urine incontinence.  Pt without complaints.  He seems to have some memory issues, is unsure who he lives with, and how many children he has.  He does not know the date or his birthdate, does not know the president.  No documented history of dementia.  He denies chest pain, shortness of breath, abd pain, n/v/d, fevers or chills Past Medical History  Diagnosis Date  . DIABETES MELLITUS, TYPE II 12/08/2006  . DUODENAL ULCER 12/08/2006  . THORACOTOMY, HX OF 02/27/1994   History reviewed. No pertinent past surgical history. No family history on file. History  Substance Use Topics  . Smoking status: Former Games developer  . Smokeless tobacco: Never Used  . Alcohol Use: No    Review of Systems  Unable to perform ROS: Other  confusion    Allergies  Review of patient's allergies indicates no known allergies.  Home Medications   Prior to Admission medications   Medication Sig Start Date End Date Taking? Authorizing Provider  hydrochlorothiazide (HYDRODIURIL) 25 MG tablet Take 1 tablet (25 mg total) by mouth daily. 07/16/14 07/16/15  Tyrone Nine, MD  levothyroxine (LEVOTHROID) 25 MCG tablet Take 1 tablet (25 mcg total) by mouth daily. 07/16/14 07/16/15  Tyrone Nine, MD  NIFEdipine (ADALAT CC) 90 MG 24 hr tablet Take 1 tablet (90 mg total) by mouth daily. 07/16/14 07/16/15  Tyrone Nine, MD    BP 157/73  Temp(Src) 97.4 F (36.3 C) (Oral)  Resp 17  SpO2 100% Physical Exam  Nursing note and vitals reviewed. Constitutional: He appears well-developed and well-nourished. No distress.  Pt has poor hygiene, extremely long and curled toenails, long dirty fingernails and overall disheveled.  Pt appears thin, NAD    HENT:  Head: Normocephalic and atraumatic.  Nose: Nose normal.  Mouth/Throat: Oropharynx is clear and moist.  Eyes: Conjunctivae and EOM are normal. Pupils are equal, round, and reactive to light.  Neck: Normal range of motion. Neck supple. No JVD present. No tracheal deviation present. No thyromegaly present.  Cardiovascular: Normal rate, regular rhythm, normal heart sounds and intact distal pulses.  Exam reveals no gallop and no friction rub.   No murmur heard. Pulmonary/Chest: Effort normal and breath sounds normal. No stridor. No respiratory distress. He has no wheezes. He has no rales. He exhibits no tenderness.  Abdominal: Soft. Bowel sounds are normal. He exhibits no distension and no mass. There is no tenderness. There is no rebound and no guarding.  Musculoskeletal: Normal range of motion. He exhibits no edema and no tenderness.  Lymphadenopathy:    He has no cervical adenopathy.  Neurological: He is alert. He displays normal reflexes. No cranial nerve deficit. He exhibits normal muscle tone. Coordination normal.  Oriented to self and in hospital only  Skin: Skin is warm and dry. No rash noted. No erythema. No pallor.  Psychiatric: He has a  normal mood and affect. His behavior is normal.    ED Course  Procedures (including critical care time) Labs Review Labs Reviewed  CBC WITH DIFFERENTIAL - Abnormal; Notable for the following:    WBC 3.6 (*)    Hemoglobin 10.7 (*)    HCT 33.6 (*)    MCV 74.8 (*)    MCH 23.8 (*)    RDW 18.1 (*)    All other components within normal limits  COMPREHENSIVE METABOLIC PANEL - Abnormal; Notable for the following:     Glucose, Bld 112 (*)    BUN 35 (*)    Creatinine, Ser 1.72 (*)    Albumin 2.9 (*)    GFR calc non Af Amer 34 (*)    GFR calc Af Amer 40 (*)    All other components within normal limits  TROPONIN I - Abnormal; Notable for the following:    Troponin I 0.81 (*)    All other components within normal limits  TSH - Abnormal; Notable for the following:    TSH 21.740 (*)    All other components within normal limits  URINALYSIS, ROUTINE W REFLEX MICROSCOPIC  T4  I-STAT CG4 LACTIC ACID, ED    Imaging Review Ct Head Wo Contrast  08/25/2014   CLINICAL DATA:  Confused, disoriented, right-sided weakness  EXAM: CT HEAD WITHOUT CONTRAST  TECHNIQUE: Contiguous axial images were obtained from the base of the skull through the vertex without intravenous contrast.  COMPARISON:  None.  FINDINGS: No evidence of parenchymal hemorrhage or extra-axial fluid collection. No mass lesion, mass effect, or midline shift.  No CT evidence of acute infarction.  Subcortical white matter and periventricular small vessel ischemic changes. Intracranial atherosclerosis.  Global cortical atrophy.  Secondary ventricular prominence.  The visualized paranasal sinuses are essentially clear. The mastoid air cells are unopacified.  No evidence of calvarial fracture.  IMPRESSION: No evidence of acute intracranial abnormality.  Atrophy with secondary ventriculomegaly.  Small vessel ischemic changes with intracranial atherosclerosis.   Electronically Signed   By: Charline Bills M.D.   On: 08/25/2014 23:56     EKG Interpretation   Date/Time:  Sunday August 25 2014 23:13:03 EDT Ventricular Rate:  68 PR Interval:  150 QRS Duration: 73 QT Interval:  530 QTC Calculation: 564 R Axis:   76 Text Interpretation:  Sinus rhythm Atrial premature complex Anteroseptal  infarct, old Nonspecific T abnormalities, lateral leads Prolonged QT  interval T waves flattened anterolaterally new from prior.  Wellen's type  wave in V3 new Confirmed  by Sarya Linenberger  MD, Shilo Philipson ([id number]) on 08/25/2014 11:51:54  PM      MDM   Final diagnoses:  Near syncope  Hypothyroidism, unspecified hypothyroidism type  Weakness  Confusion    80 yo male with syncopal episode, h/o htn, hypothyroidism, though he denies having medical problems and denies taking medications.  Will get labs, ekg, head CT  12:40 AM D/w Dr Donnie Aho with cardiology regarding elevated troponin.  Pt does not have any chest pain, shortness of breath, or nausea/vomiting to suggest angina.  Pt has no ST elevation on EKG, has flattened t waves compared with old.  He recommends following serial troponins and not starting heparin at this time since patient is not symptomatic.  Olivia Mackie, MD 08/26/14 [phone]

## 2014-08-25 NOTE — ED Notes (Addendum)
Report from [facility]> Pt "collapsed" in floor at home in front of family.  Family denied any seizure activity to EMS.  On EMS arrival pt alert, disoriented, R sided weakness.  Incontinent of urine.  CBG 150.  Once EMS reassessed weakness in ambulance it had resolved.

## 2014-08-25 NOTE — ED Notes (Signed)
Transported to CT 

## 2014-08-26 DIAGNOSIS — F0150 Vascular dementia without behavioral disturbance: Secondary | ICD-10-CM | POA: Diagnosis present

## 2014-08-26 DIAGNOSIS — R748 Abnormal levels of other serum enzymes: Secondary | ICD-10-CM | POA: Diagnosis present

## 2014-08-26 DIAGNOSIS — R636 Underweight: Secondary | ICD-10-CM | POA: Diagnosis present

## 2014-08-26 DIAGNOSIS — N189 Chronic kidney disease, unspecified: Secondary | ICD-10-CM | POA: Diagnosis present

## 2014-08-26 DIAGNOSIS — I5020 Unspecified systolic (congestive) heart failure: Secondary | ICD-10-CM | POA: Diagnosis present

## 2014-08-26 DIAGNOSIS — E119 Type 2 diabetes mellitus without complications: Secondary | ICD-10-CM | POA: Diagnosis present

## 2014-08-26 DIAGNOSIS — F29 Unspecified psychosis not due to a substance or known physiological condition: Secondary | ICD-10-CM | POA: Diagnosis present

## 2014-08-26 DIAGNOSIS — E46 Unspecified protein-calorie malnutrition: Secondary | ICD-10-CM | POA: Diagnosis present

## 2014-08-26 DIAGNOSIS — R7989 Other specified abnormal findings of blood chemistry: Secondary | ICD-10-CM

## 2014-08-26 DIAGNOSIS — N179 Acute kidney failure, unspecified: Secondary | ICD-10-CM | POA: Diagnosis present

## 2014-08-26 DIAGNOSIS — I359 Nonrheumatic aortic valve disorder, unspecified: Secondary | ICD-10-CM

## 2014-08-26 DIAGNOSIS — E785 Hyperlipidemia, unspecified: Secondary | ICD-10-CM | POA: Diagnosis present

## 2014-08-26 DIAGNOSIS — I672 Cerebral atherosclerosis: Secondary | ICD-10-CM | POA: Diagnosis present

## 2014-08-26 DIAGNOSIS — R55 Syncope and collapse: Secondary | ICD-10-CM

## 2014-08-26 DIAGNOSIS — Z681 Body mass index (BMI) 19 or less, adult: Secondary | ICD-10-CM | POA: Diagnosis not present

## 2014-08-26 DIAGNOSIS — E43 Unspecified severe protein-calorie malnutrition: Secondary | ICD-10-CM | POA: Diagnosis present

## 2014-08-26 DIAGNOSIS — R4182 Altered mental status, unspecified: Secondary | ICD-10-CM | POA: Diagnosis present

## 2014-08-26 DIAGNOSIS — I509 Heart failure, unspecified: Secondary | ICD-10-CM | POA: Diagnosis present

## 2014-08-26 DIAGNOSIS — Z91199 Patient's noncompliance with other medical treatment and regimen due to unspecified reason: Secondary | ICD-10-CM | POA: Diagnosis not present

## 2014-08-26 DIAGNOSIS — R64 Cachexia: Secondary | ICD-10-CM | POA: Diagnosis present

## 2014-08-26 DIAGNOSIS — E039 Hypothyroidism, unspecified: Secondary | ICD-10-CM | POA: Diagnosis present

## 2014-08-26 DIAGNOSIS — Z9119 Patient's noncompliance with other medical treatment and regimen: Secondary | ICD-10-CM | POA: Diagnosis not present

## 2014-08-26 DIAGNOSIS — G9340 Encephalopathy, unspecified: Secondary | ICD-10-CM | POA: Diagnosis present

## 2014-08-26 DIAGNOSIS — D509 Iron deficiency anemia, unspecified: Secondary | ICD-10-CM | POA: Diagnosis present

## 2014-08-26 DIAGNOSIS — N400 Enlarged prostate without lower urinary tract symptoms: Secondary | ICD-10-CM | POA: Diagnosis present

## 2014-08-26 DIAGNOSIS — I10 Essential (primary) hypertension: Secondary | ICD-10-CM

## 2014-08-26 DIAGNOSIS — Z87891 Personal history of nicotine dependence: Secondary | ICD-10-CM | POA: Diagnosis not present

## 2014-08-26 DIAGNOSIS — I129 Hypertensive chronic kidney disease with stage 1 through stage 4 chronic kidney disease, or unspecified chronic kidney disease: Secondary | ICD-10-CM | POA: Diagnosis present

## 2014-08-26 LAB — COMPREHENSIVE METABOLIC PANEL
ALBUMIN: 3 g/dL — AB (ref 3.5–5.2)
ALK PHOS: 87 U/L (ref 39–117)
ALT: 7 U/L (ref 0–53)
ALT: 7 U/L (ref 0–53)
ANION GAP: 12 (ref 5–15)
AST: 17 U/L (ref 0–37)
AST: 18 U/L (ref 0–37)
Albumin: 2.9 g/dL — ABNORMAL LOW (ref 3.5–5.2)
Alkaline Phosphatase: 85 U/L (ref 39–117)
Anion gap: 13 (ref 5–15)
BILIRUBIN TOTAL: 0.3 mg/dL (ref 0.3–1.2)
BILIRUBIN TOTAL: 0.4 mg/dL (ref 0.3–1.2)
BUN: 35 mg/dL — AB (ref 6–23)
BUN: 33 mg/dL — ABNORMAL HIGH (ref 6–23)
CALCIUM: 9 mg/dL (ref 8.4–10.5)
CALCIUM: 9 mg/dL (ref 8.4–10.5)
CHLORIDE: 106 meq/L (ref 96–112)
CO2: 22 mEq/L (ref 19–32)
CO2: 21 meq/L (ref 19–32)
CREATININE: 1.72 mg/dL — AB (ref 0.50–1.35)
CREATININE: 1.56 mg/dL — AB (ref 0.50–1.35)
Chloride: 106 mEq/L (ref 96–112)
GFR calc Af Amer: 40 mL/min — ABNORMAL LOW (ref >90)
GFR calc Af Amer: 45 mL/min — ABNORMAL LOW (ref >90)
GFR calc non Af Amer: 39 mL/min — ABNORMAL LOW (ref >90)
GFR, EST NON AFRICAN AMERICAN: 34 mL/min — AB (ref >90)
GLUCOSE: 112 mg/dL — AB (ref 70–99)
Glucose, Bld: 122 mg/dL — ABNORMAL HIGH (ref 70–99)
POTASSIUM: 4.7 meq/L (ref 3.7–5.3)
Potassium: 4.4 mEq/L (ref 3.7–5.3)
SODIUM: 140 meq/L (ref 137–147)
Sodium: 140 mEq/L (ref 137–147)
TOTAL PROTEIN: 6.1 g/dL (ref 6.0–8.3)
Total Protein: 5.9 g/dL — ABNORMAL LOW (ref 6.0–8.3)

## 2014-08-26 LAB — IRON AND TIBC
IRON: 74 ug/dL (ref 42–135)
SATURATION RATIOS: 43 % (ref 20–55)
TIBC: 171 ug/dL — AB (ref 215–435)
UIBC: 97 ug/dL — ABNORMAL LOW (ref 125–400)

## 2014-08-26 LAB — URINALYSIS, ROUTINE W REFLEX MICROSCOPIC
Bilirubin Urine: NEGATIVE
Glucose, UA: NEGATIVE mg/dL
Ketones, ur: NEGATIVE mg/dL
LEUKOCYTES UA: NEGATIVE
Nitrite: NEGATIVE
PH: 5 (ref 5.0–8.0)
PROTEIN: 30 mg/dL — AB
SPECIFIC GRAVITY, URINE: 1.015 (ref 1.005–1.030)
UROBILINOGEN UA: 0.2 mg/dL (ref 0.0–1.0)

## 2014-08-26 LAB — CBC
HCT: 32 % — ABNORMAL LOW (ref 39.0–52.0)
Hemoglobin: 10.3 g/dL — ABNORMAL LOW (ref 13.0–17.0)
MCH: 23.7 pg — ABNORMAL LOW (ref 26.0–34.0)
MCHC: 32.2 g/dL (ref 30.0–36.0)
MCV: 73.7 fL — AB (ref 78.0–100.0)
Platelets: UNDETERMINED 10*3/uL (ref 150–400)
RBC: 4.34 MIL/uL (ref 4.22–5.81)
RDW: 18.1 % — ABNORMAL HIGH (ref 11.5–15.5)
WBC: 4.3 10*3/uL (ref 4.0–10.5)

## 2014-08-26 LAB — VITAMIN B12: Vitamin B-12: 584 pg/mL (ref 211–911)

## 2014-08-26 LAB — RAPID URINE DRUG SCREEN, HOSP PERFORMED
Amphetamines: NOT DETECTED
Barbiturates: NOT DETECTED
Benzodiazepines: NOT DETECTED
COCAINE: NOT DETECTED
OPIATES: NOT DETECTED
Tetrahydrocannabinol: NOT DETECTED

## 2014-08-26 LAB — TROPONIN I
Troponin I: 0.81 ng/mL (ref <0.30)
Troponin I: 0.75 ng/mL (ref <0.30)
Troponin I: 0.9 ng/mL (ref <0.30)
Troponin I: 0.76 ng/mL (ref <0.30)

## 2014-08-26 LAB — LIPID PANEL
CHOL/HDL RATIO: 3.4 ratio
CHOLESTEROL: 226 mg/dL — AB (ref 0–200)
HDL: 67 mg/dL (ref >39)
LDL Cholesterol: 144 mg/dL — ABNORMAL HIGH (ref 0–99)
TRIGLYCERIDES: 75 mg/dL (ref <150)
VLDL: 15 mg/dL (ref 0–40)

## 2014-08-26 LAB — URINE MICROSCOPIC-ADD ON

## 2014-08-26 LAB — FERRITIN: FERRITIN: 302 ng/mL (ref 22–322)

## 2014-08-26 LAB — FOLATE: Folate: 6.8 ng/mL

## 2014-08-26 LAB — TSH: TSH: 21.74 u[IU]/mL — ABNORMAL HIGH (ref 0.350–4.500)

## 2014-08-26 LAB — RETICULOCYTES
RBC.: 4.34 MIL/uL (ref 4.22–5.81)
Retic Count, Absolute: 26 10*3/uL (ref 19.0–186.0)
Retic Ct Pct: 0.6 % (ref 0.4–3.1)

## 2014-08-26 LAB — T4: T4, Total: 7.3 ug/dL (ref 4.5–12.0)

## 2014-08-26 LAB — HEMOGLOBIN A1C
Hgb A1c MFr Bld: 6.2 % — ABNORMAL HIGH (ref <5.7)
Mean Plasma Glucose: 131 mg/dL — ABNORMAL HIGH (ref <117)

## 2014-08-26 MED ORDER — INFLUENZA VAC SPLIT QUAD 0.5 ML IM SUSY
0.5000 mL | PREFILLED_SYRINGE | INTRAMUSCULAR | Status: AC
  Administered 2014-08-27: 0.5 mL via INTRAMUSCULAR
  Filled 2014-08-26: qty 0.5

## 2014-08-26 MED ORDER — HYDROCHLOROTHIAZIDE 25 MG PO TABS
25.0000 mg | ORAL_TABLET | Freq: Every day | ORAL | Status: DC
  Administered 2014-08-26 – 2014-08-28 (×3): 25 mg via ORAL
  Filled 2014-08-26 (×3): qty 1

## 2014-08-26 MED ORDER — ENSURE COMPLETE PO LIQD
237.0000 mL | Freq: Three times a day (TID) | ORAL | Status: DC
  Administered 2014-08-26 – 2014-08-28 (×7): 237 mL via ORAL

## 2014-08-26 MED ORDER — LEVOTHYROXINE SODIUM 25 MCG PO TABS
25.0000 ug | ORAL_TABLET | Freq: Every day | ORAL | Status: DC
  Administered 2014-08-26 – 2014-08-28 (×3): 25 ug via ORAL
  Filled 2014-08-26 (×4): qty 1

## 2014-08-26 MED ORDER — ATORVASTATIN CALCIUM 10 MG PO TABS
10.0000 mg | ORAL_TABLET | Freq: Every day | ORAL | Status: DC
  Filled 2014-08-26 (×2): qty 1

## 2014-08-26 MED ORDER — ACETAMINOPHEN 650 MG RE SUPP: 650.0000 mg | Freq: Four times a day (QID) | RECTAL | Status: DC | PRN

## 2014-08-26 MED ORDER — NIFEDIPINE ER OSMOTIC RELEASE 90 MG PO TB24
90.0000 mg | ORAL_TABLET | Freq: Every day | ORAL | Status: DC
  Administered 2014-08-26: 90 mg via ORAL
  Filled 2014-08-26 (×2): qty 1

## 2014-08-26 MED ORDER — ASPIRIN EC 81 MG PO TBEC
81.0000 mg | DELAYED_RELEASE_TABLET | Freq: Every day | ORAL | Status: DC
  Administered 2014-08-27 – 2014-08-28 (×2): 81 mg via ORAL
  Filled 2014-08-26 (×2): qty 1

## 2014-08-26 MED ORDER — SODIUM CHLORIDE 0.45 % IV SOLN
INTRAVENOUS | Status: DC
  Administered 2014-08-26: 04:00:00 via INTRAVENOUS

## 2014-08-26 MED ORDER — ACETAMINOPHEN 325 MG PO TABS: 650.0000 mg | ORAL_TABLET | Freq: Four times a day (QID) | ORAL | Status: DC | PRN

## 2014-08-26 MED ORDER — SODIUM CHLORIDE 0.9 % IV BOLUS (SEPSIS)
1000.0000 mL | Freq: Once | INTRAVENOUS | Status: AC
  Administered 2014-08-26: 1000 mL via INTRAVENOUS

## 2014-08-26 MED ORDER — PNEUMOCOCCAL VAC POLYVALENT 25 MCG/0.5ML IJ INJ
0.5000 mL | INJECTION | INTRAMUSCULAR | Status: AC
  Administered 2014-08-27: 0.5 mL via INTRAMUSCULAR
  Filled 2014-08-26: qty 0.5

## 2014-08-26 MED ORDER — ASPIRIN 325 MG PO TABS
325.0000 mg | ORAL_TABLET | Freq: Once | ORAL | Status: DC
  Filled 2014-08-26: qty 1

## 2014-08-26 MED ORDER — HEPARIN SODIUM (PORCINE) 5000 UNIT/ML IJ SOLN
5000.0000 [IU] | Freq: Three times a day (TID) | INTRAMUSCULAR | Status: DC
  Administered 2014-08-26 – 2014-08-28 (×6): 5000 [IU] via SUBCUTANEOUS
  Filled 2014-08-26 (×9): qty 1

## 2014-08-26 NOTE — Progress Notes (Addendum)
SUBJECTIVE:   No complaints  OBJECTIVE:   Vitals:   Filed Vitals:   08/26/14 0215 08/26/14 0230 08/26/14 0300 08/26/14 0400  BP: 158/87 176/84 164/75 191/85  Pulse: 55 52  58  Temp:    97.6 F (36.4 C)  TempSrc:      Resp: 20   18  Height:    6' (1.829 m)  Weight:    121 lb (54.885 kg)  SpO2: 100% 100%  100%   I&O's:   Intake/Output Summary (Last 24 hours) at 08/26/14 8657 Last data filed at 08/26/14 0215  Gross per 24 hour  Intake   1000 ml  Output      0 ml  Net   1000 ml   TELEMETRY: Reviewed telemetry pt in NSR:     PHYSICAL EXAM General: Well developed, well nourished, in no acute distress Head: Eyes PERRLA, No xanthomas.   Normal cephalic and atramatic  Lungs:   Clear bilaterally to auscultation and percussion. Heart:   HRRR S1 S2 Pulses are 2+ & equal. Abdomen: Bowel sounds are positive, abdomen soft and non-tender without masses  Extremities:   No clubbing, cyanosis or edema.  DP +1 Neuro: Alert and oriented X 3. Psych:  Good affect, responds appropriately   LABS: Basic Metabolic Panel:  Recent Labs  84/69/62 2329 08/26/14 0503  NA 140 140  K 4.7 4.4  CL 106 106  CO2 22 21  GLUCOSE 112* 122*  BUN 35* 33*  CREATININE 1.72* 1.56*  CALCIUM 9.0 9.0   Liver Function Tests:  Recent Labs  08/25/14 2329 08/26/14 0503  AST 17 18  ALT 7 7  ALKPHOS 87 85  BILITOT 0.3 0.4  PROT 6.1 5.9*  ALBUMIN 2.9* 3.0*   No results found for this basename: LIPASE, AMYLASE,  in the last 72 hours CBC:  Recent Labs  08/25/14 2329 08/26/14 0503  WBC 3.6* 4.3  NEUTROABS 2.4  --   HGB 10.7* 10.3*  HCT 33.6* 32.0*  MCV 74.8* 73.7*  PLT 249 PLATELET CLUMPS NOTED ON SMEAR, UNABLE TO ESTIMATE   Cardiac Enzymes:  Recent Labs  08/25/14 2329 08/26/14 0503  TROPONINI 0.81* 0.75*   BNP: No components found with this basename: POCBNP,  D-Dimer: No results found for this basename: DDIMER,  in the last 72 hours Hemoglobin A1C: No results found for this  basename: HGBA1C,  in the last 72 hours Fasting Lipid Panel: No results found for this basename: CHOL, HDL, LDLCALC, TRIG, CHOLHDL, LDLDIRECT,  in the last 72 hours Thyroid Function Tests:  Recent Labs  08/25/14 2329  TSH 21.740*   Anemia Panel:  Recent Labs  08/26/14 0503  RETICCTPCT 0.6   Coag Panel:   No results found for this basename: INR, PROTIME    RADIOLOGY: Ct Head Wo Contrast  08/25/2014   CLINICAL DATA:  Confused, disoriented, right-sided weakness  EXAM: CT HEAD WITHOUT CONTRAST  TECHNIQUE: Contiguous axial images were obtained from the base of the skull through the vertex without intravenous contrast.  COMPARISON:  None.  FINDINGS: No evidence of parenchymal hemorrhage or extra-axial fluid collection. No mass lesion, mass effect, or midline shift.  No CT evidence of acute infarction.  Subcortical white matter and periventricular small vessel ischemic changes. Intracranial atherosclerosis.  Global cortical atrophy.  Secondary ventricular prominence.  The visualized paranasal sinuses are essentially clear. The mastoid air cells are unopacified.  No evidence of calvarial fracture.  IMPRESSION: No evidence of acute intracranial abnormality.  Atrophy with secondary  ventriculomegaly.  Small vessel ischemic changes with intracranial atherosclerosis.   Electronically Signed   By: Charline Bills M.D.   On: 08/25/2014 23:56    IMPRESSIONS:  1. Syncopal episode in an elderly male need to rule out arrhythmic event or neurologic event  2. Mild elevation of troponin - enzyme elevation minimal and not much of a trend.  I do not think this represents a true NSTEMI.   3. Dementia  4. Hypertension - poorly controlled 5. Hypothyroidism  6. History of medical noncompliance  7. Anemia   RECOMMENDATION:  1. I would not start heparin given the fairly flat bump in troponin that could be related to fall in setting of renal insuff 2. Obtain echocardiogram to assess LVF - if LVF is normal  would not pursue further ischemic w/u - I talked with his daughter whom he lives with and she does not want to consider cath at this time 3. Telemetry monitoring for arrhythmia - would send home with 30 day heart monitor 4. Evaluation for anemia 5. Consider adding Hydralazine BID for better BP control   Quintella Reichert, MD  08/26/2014  8:12 AM

## 2014-08-26 NOTE — Consult Note (Signed)
Cardiology Consult Note  Admit date: 08/25/2014 Name: Timothy Mayo 80 y.o.  male DOB:  [birth year] MRN:  [id number]  Today's date:  08/26/2014  Referring Physician:    [facility]  Primary Physician:   Teaching service  Reason for Consultation:   Syncope, abnormal troponin  IMPRESSIONS: 1. Syncopal episode in an elderly male need to rule out arrhythmic event or neurologic event 2. Mild elevation of troponin with a clinical diagnosis of MI 3. Dementia 4. Hypertension 5. Hypothyroidism 6. History of medical noncompliance 7. Anemia  RECOMMENDATION: 1. I would not start heparin at this time but I would cycle troponins. If he develops a significant rise in troponin or evolutionary EKG changes can consider whether or not to do an ischemic workup at his age 36. Obtain echocardiogram 3. Telemetry monitoring for arrhythmia 4. Evaluation for anemia  HISTORY: This 80 year-old male is a somewhat poor historian. He has dementia and there've been problems with medical compliance in the past. He was standing in front of his family and collapse suddenly with some incontinence and had some initially some right-sided weakness that resolved evidently when brought here by EMS. History is difficult because of his dementia. He was found to have an elevated troponin. He evidently lives with a daughter and basically stays in the house most of the time and walks from his room to the kitchen but really does not get out much. He denies any chest pain or shortness of breath and the daughter confirms this. No prior history of syncope.  Past Medical History  Diagnosis Date  . DIABETES MELLITUS, TYPE II 12/08/2006  . DUODENAL ULCER 12/08/2006  . THORACOTOMY, HX OF 02/27/1994      History reviewed. No pertinent past surgical history.   Allergies:  has No Known Allergies.   Medications: Hydrochlorothiazide 25 mg daily   Synthroid 25 mcg daily Nifedipine extended release 90 mg daily  Family  History: No family status information on file.    Social History:   reports that he has quit smoking. He has never used smokeless tobacco. He reports that he does not drink alcohol or use illicit drugs.   History   Social History Narrative   Lives with daughter    Review of Systems: No complaints of shortness or breath or chest pain. No bowel complaints, mild arthritis  Physical Exam: BP 175/94  Pulse 60  Temp(Src) 97.4 F (36.3 C) (Oral)  Resp 22  SpO2 100%  General appearance: Very thin, Disheveled-appearing elderly male currently in no acute distress, smelling of urine Head: Normocephalic, without obvious abnormality, atraumatic Eyes: conjunctivae/corneas clear. PERRL, EOM's intact. Fundi not examined  Neck: no adenopathy, no carotid bruit, no JVD and supple, symmetrical, trachea midline Lungs: clear to auscultation bilaterally Heart: Regular rate and rhythm, normal S1-S2, no S3, 1-2/6 murmur Abdomen: soft, non-tender; bowel sounds normal; no masses,  no organomegaly Rectal: deferred Extremities: extremities normal, atraumatic, no cyanosis or edema Pulses: 2+ and symmetric Neurologic: Grossly normal  Labs: CBC  Recent Labs  08/25/14 2329  WBC 3.6*  RBC 4.49  HGB 10.7*  HCT 33.6*  PLT 249  MCV 74.8*  MCH 23.8*  MCHC 31.8  RDW 18.1*  LYMPHSABS 1.0  MONOABS 0.1  EOSABS 0.0  BASOSABS 0.0   CMP   Recent Labs  08/25/14 2329  NA 140  K 4.7  CL 106  CO2 22  GLUCOSE 112*  BUN 35*  CREATININE 1.72*  CALCIUM 9.0  PROT 6.1  ALBUMIN 2.9*  AST 17  ALT 7  ALKPHOS 87  BILITOT 0.3  GFRNONAA 34*  GFRAA 40*   Cardiac Panel (last 3 results)  Recent Labs  08/25/14 2329  TROPONINI 0.81*    Radiology: No chest x-ray available for review  EKG: Diffusely flattened T waves, T wave inversion in V3 new since previous EKG on  Signed:  W. Ashley Royalty MD [facility]   Cardiology Consultant  08/26/2014, 1:15 AM

## 2014-08-26 NOTE — Progress Notes (Signed)
Utilization review complete 

## 2014-08-26 NOTE — Progress Notes (Signed)
INITIAL NUTRITION ASSESSMENT  DOCUMENTATION CODES Per approved criteria  -Severe malnutrition in the context of chronic illness -Underweight   INTERVENTION:  Ensure Complete PO TID, each supplement provides 350 kcal and 13 grams of protein  Nursing staff to assist patient with meals.  NUTRITION DIAGNOSIS: Malnutrition related to inadequate oral intake as evidenced by severe depletion of muscle and subcutaneous fat mass.   Goal: Intake to meet >90% of estimated nutrition needs.  Monitor:  PO intake, labs, weight trend.  Reason for Assessment: MD Consult, MST  80 y.o. male  Admitting Dx: Confusion, Near-syncope  ASSESSMENT: 80 y.o. male presenting with confusion in the setting of a questionable syncopal episode . PMH is significant for HTN, hypothyroidism, and anemia. Also with AKI.  Patient is underweight with BMI = 16.4. Patient seemed confused during RD visit. His breakfast tray was at bedside untouched. Discussed with RN, patient may need assistance with his meals. He said that he likes Ensure. Brought him one to drink.   Nutrition Focused Physical Exam:  Subcutaneous Fat:  Orbital Region: severe depletion Upper Arm Region: severe depletion Thoracic and Lumbar Region: severe depletion  Muscle:  Temple Region: severe depletion Clavicle Bone Region: severe depletion Clavicle and Acromion Bone Region: severe depletion Scapular Bone Region: severe depletion Dorsal Hand: severe depletion Patellar Region: severe depletion Anterior Thigh Region: severe depletion Posterior Calf Region: severe depletion  Edema: none  Pt meets criteria for severe MALNUTRITION in the context of chronic illness as evidenced by severe depletion of muscle and subcutaneous fat mass.  Height: Ht Readings from Last 1 Encounters:  08/26/14 6' (1.829 m)    Weight: Wt Readings from Last 1 Encounters:  08/26/14 121 lb (54.885 kg)    Ideal Body Weight: 80.9 kg  % Ideal Body Weight:  68%  Wt Readings from Last 10 Encounters:  08/26/14 121 lb (54.885 kg)  07/16/14 123 lb (55.792 kg)  02/09/12 135 lb (61.236 kg)  11/12/11 132 lb (59.875 kg)  11/02/11 140 lb 4.8 oz (63.64 kg)  10/10/07 152 lb 6.4 oz (69.128 kg)  04/18/07 155 lb (70.308 kg)    Usual Body Weight: 123 lb 1.5 months ago  % Usual Body Weight: 98%  BMI:  Body mass index is 16.41 kg/(m^2). Underweight  Estimated Nutritional Needs: Kcal: 1800-2000 Protein: 85-100 gm Fluid: 1.8-2 L  Skin: intact  Diet Order: Dysphagia 3 with thin liquids  EDUCATION NEEDS: -Education not appropriate at this time   Intake/Output Summary (Last 24 hours) at 08/26/14 1013 Last data filed at 08/26/14 0215  Gross per 24 hour  Intake   1000 ml  Output      0 ml  Net   1000 ml    Last BM: none documented since admission   Labs:   Recent Labs Lab 08/25/14 2329 08/26/14 0503  NA 140 140  K 4.7 4.4  CL 106 106  CO2 22 21  BUN 35* 33*  CREATININE 1.72* 1.56*  CALCIUM 9.0 9.0  GLUCOSE 112* 122*    CBG (last 3)  No results found for this basename: GLUCAP,  in the last 72 hours  Scheduled Meds: . aspirin  325 mg Oral Once  . heparin  5,000 Units Subcutaneous 3 times per day  . hydrochlorothiazide  25 mg Oral Daily  . [START ON 08/27/2014] Influenza vac split quadrivalent PF  0.5 mL Intramuscular Tomorrow-1000  . levothyroxine  25 mcg Oral QAC breakfast  . NIFEdipine  90 mg Oral Daily  . [START ON  08/27/2014] pneumococcal 23 valent vaccine  0.5 mL Intramuscular Tomorrow-1000    Continuous Infusions: . sodium chloride 100 mL/hr at 08/26/14 0400    Past Medical History  Diagnosis Date  . DIABETES MELLITUS, TYPE II 12/08/2006  . DUODENAL ULCER 12/08/2006  . THORACOTOMY, HX OF 02/27/1994    History reviewed. No pertinent past surgical history.  Joaquin Courts, RD, LDN, CNSC Pager [phone] After Hours Pager [phone]

## 2014-08-26 NOTE — Progress Notes (Signed)
  Echocardiogram 2D Echocardiogram has been performed.  Timothy Mayo 08/26/2014, 11:05 AM

## 2014-08-26 NOTE — Progress Notes (Signed)
PT Cancellation Note  Patient Details Name: Timothy Mayo MRN: [id number] DOB: 20-Jan-1925   Cancelled Treatment:    Reason Eval/Treat Not Completed: Patient not medically readypt on bedrest until 1557.  Will f/u after that time.     Estelita Iten, Alison Murray 08/26/2014, 9:35 AM

## 2014-08-26 NOTE — ED Notes (Signed)
Pt reminded about need for urine specimen.  Unable to void at this time.

## 2014-08-26 NOTE — Progress Notes (Signed)
OT Cancellation Note  Patient Details Name: Timothy Mayo MRN: [id number] DOB: [birth year]   Cancelled Treatment:    Reason Eval/Treat Not Completed: Patient not medically ready (active bedrest orders. will assess when activity level updated)  [facility], OTR/L  [phone] 08/26/2014 08/26/2014, 3:10 PM

## 2014-08-26 NOTE — H&P (Signed)
Family Medicine Teaching [facility] Admission History and Physical Service Pager: [phone]  Patient name: Timothy Mayo Medical record number: [id number] Date of birth: [birth year] Age: 80 y.o. Gender: male  Primary Care Provider: Hazeline Junker, MD Consultants: Cardiology Code Status: Full  Chief Complaint: Confusion, near-syncope  Assessment and Plan: Timothy Mayo is a 80 y.o. male presenting with confusion in the setting of a questionable syncopal episode . PMH is significant for HTN, hypothyroidism, and anemia. Also with AKI here  #Acute encephalopathy. Likely secondary to severe hypothyroidism (TSH 21.74) in setting of medication nonadherence. Patient also likely has a component of vascular dementia playing a role given head CT findings and given acute drop in cognition. No current focal neurological findings suggestive of CVA. Can also consider delirium secondary to occult infection, however patient is afebrile and has a normal WBC. Patient is currently improving, though still not back at baseline per daughter.  - Restart synthroid 67mcg/day - f/u UDS - f/u UA - consider obtaining brain MRI to better characterize acuity of ischemic changes found on head CT - f/u CBC, CMP - consider MMSE  #Elevated troponin. No chest pain. Last TSH 21.74 as per above. Cardiology consulted in the ED. Per their recommendations, will proceed with conservative work up for now and consider more invasive work up if he develops a significant rise in troponin or EKG changes.  - cycle troponins, EKG in AM - f/u echocardiogram - f/u A1C, lipid panel  #AKI. Creatinine 1.72 on admission, up from  baseline 1.3 to 1.5. Likely prerenal secondary to decreased PO intake. -IVF @ 100cc/hr -f/u CMP  # Microcytic Anemia. Hgb 10.7 here, MCV 74.8. Baseline HgB 10-11. Has history of EGD showing gastritis and colonoscopy significant for internal/external hemorrhoids and heme positive stool in 2007. - f/u anemia  panel - outpatient follow-up with GI for microcytic anemia  # Protein-calorie malnutrition: patient with decreased albumin. Has los a lot about 14lbs since two years ago - nutrition consult  #HTN. Continue home HCTZ and nifedipine  #Hypothyroid. Synthroid as above.  FEN/GI: IVF @ 100cc/hr, heart healthy diet Prophylaxis: subQ heparin  Disposition: Admitted under attending Dr Mauricio Po for above evaluation and management.   History of Present Illness: Timothy Mayo is a 80 y.o. male presenting with increased confusion in the setting of a questionable syncopal episode earlier today. History is mainly provided by the patient's daughter. She reports that the patient was in the kitchen getting a snack when he turned around and started moving very slowly. She reports that he did not pass out or fall. He was responsive to her commands but not at baseline. She walked him to a chair where he sat down, and then slumped in the chair and EMS was called. Patient had no chest pain or shortness of breath associated with the episode. Additionally he had no weakness or word finding issues. Has had some recent memory loss, but no prior diagnosis of dementia. Is able to perform most ADLs independently at home besides cooking. Does not take medications regularly.  In the ED, labs were remarkable for elevated troponin (0.81), elevated Creatinine (1.72, baseline 1.3 to 1.5), and increased TSH (21.740). CT head showed no acute abnormality.Cardiology was consulted and recommended cycling troponins with the lack of active chest pain. Per the patient's daughter his mental status improved some, but still not back at his baseline (he did not know the year, or the president).  No recent illnesses. No fevers or chills.  Review Of Systems: Per HPI  with the following additions, otherwise 12 point review of systems was performed and was unremarkable.  Patient Active Problem List   Diagnosis Date Noted  . Near syncope  08/26/2014  . Hypothyroid 11/12/2011  . SYMPTOM, MEMORY LOSS 04/18/2007  . CT, CHEST, ABNORMAL 12/12/2006  . BENIGN PROSTATIC HYPERTROPHY, HX OF 12/12/2006  . HYPERLIPIDEMIA 12/08/2006  . ANEMIA 12/08/2006  . HYPERTENSION 12/08/2006   Past Medical History: Past Medical History  Diagnosis Date  . DIABETES MELLITUS, TYPE II 12/08/2006  . DUODENAL ULCER 12/08/2006  . THORACOTOMY, HX OF 02/27/1994   Past Surgical History: History reviewed. No pertinent past surgical history. Social History: History  Substance Use Topics  . Smoking status: Former Games developer  . Smokeless tobacco: Never Used  . Alcohol Use: No   Please also refer to relevant sections of EMR.  Family History: No family history on file. Allergies and Medications: No Known Allergies No current facility-administered medications on file prior to encounter.   No current outpatient prescriptions on file prior to encounter.    Objective: BP 176/84  Pulse 52  Temp(Src) 97.4 F (36.3 C) (Oral)  Resp 20  SpO2 100% Exam: General: Disheveled appearing elderly man lying in bed in NAD HEENT: EOMI, PERRL, dry appearing mucus membranes Cardiovascular: Bradycardic, regular rhythm. Distal pulses 2+ bilaterally Respiratory: NWOB, CTAB Abdomen: +BS, soft, nontender, nondistended Extremities: unkempt and curled toenails, no edema Skin: Several large keloids noted across right flank, otherwise warm and dry Neuro: Decreased hearing bilaterally, otherwise CN2-12 intact. Sensation and strength intact in upper and lower extremities bilaterally. No gross focal deficits. Alert and oriented to person and place.   Labs and Imaging: CBC BMET   Recent Labs Lab 08/25/14 2329  WBC 3.6*  HGB 10.7*  HCT 33.6*  PLT 249    Recent Labs Lab 08/25/14 2329  NA 140  K 4.7  CL 106  CO2 22  BUN 35*  CREATININE 1.72*  GLUCOSE 112*  CALCIUM 9.0     EKG: NSR, Flattened T waves, new T wave inversion in V3  Ct Head Wo Contrast 08/25/2014     IMPRESSION: No evidence of acute intracranial abnormality.  Atrophy with secondary ventriculomegaly.  Small vessel ischemic changes with intracranial atherosclerosis.     Timothy Doe, MD 08/26/2014, 3:21 AM PGY-1, [facility] FPTS Intern pager: [phone], text pages welcome  I have seen and examined the patient. I have read and agree with the above note. My changes are noted in blue.  Jacquelin Hawking, MD PGY-2, [facility] 08/26/2014, 7:08 AM

## 2014-08-26 NOTE — H&P (Signed)
FMTS Attending Admit Note Patient seen and examined by me, discussed with resident team and I agree with Dr Dennison Nancy admission plan as documented.  Patient admitted with worsening confusion as reported by his daughter to the housestaff.  It is not clear whether he had a true syncopal event.  His ED workup reveals two elevated troponin I: 0.81-->0.75. T wave flattening and inversion in V3 lead.  TSH 21. CT head in ED with atherosclerotic changes. At the time of my exam this morning, he is not oriented to time or place; denies any pain or discomfort.  Specifically denies chest pain, headache, shortness of breath, or recent fevers or chills.   Exam: Alert, no apparent distress HEENT Neck supple. No JVD COR Regular S1S2 no extra sounds ABD Soft, nontender.  EXTS no lower extremity edema.   A/P: Patient presenting with mental status changes, unclear what his baseline is. Markedly elevated TSH which may explain his presentation.  Following Troponins, appreciate Cardiology input.  Team to decide whether furhter neuroimaging is warranted.  Paula Compton, MD

## 2014-08-26 NOTE — Progress Notes (Signed)
Notified Dr. Caroleen Hamman pt will not leave heart monitor on. Pt is confused and continues to remove. Notified Dr. Mayford Knife as well, family does not wish to do cath. Order placed to d/c tele. Emelda Brothers RN

## 2014-08-27 DIAGNOSIS — R413 Other amnesia: Secondary | ICD-10-CM

## 2014-08-27 DIAGNOSIS — G9340 Encephalopathy, unspecified: Secondary | ICD-10-CM | POA: Diagnosis not present

## 2014-08-27 DIAGNOSIS — E43 Unspecified severe protein-calorie malnutrition: Secondary | ICD-10-CM

## 2014-08-27 DIAGNOSIS — R5383 Other fatigue: Secondary | ICD-10-CM

## 2014-08-27 DIAGNOSIS — I420 Dilated cardiomyopathy: Secondary | ICD-10-CM | POA: Diagnosis not present

## 2014-08-27 DIAGNOSIS — R778 Other specified abnormalities of plasma proteins: Secondary | ICD-10-CM | POA: Diagnosis present

## 2014-08-27 DIAGNOSIS — E039 Hypothyroidism, unspecified: Secondary | ICD-10-CM

## 2014-08-27 DIAGNOSIS — I428 Other cardiomyopathies: Secondary | ICD-10-CM

## 2014-08-27 DIAGNOSIS — R5381 Other malaise: Secondary | ICD-10-CM

## 2014-08-27 DIAGNOSIS — R404 Transient alteration of awareness: Secondary | ICD-10-CM

## 2014-08-27 DIAGNOSIS — R7989 Other specified abnormal findings of blood chemistry: Secondary | ICD-10-CM

## 2014-08-27 LAB — CBC
HCT: 27.3 % — ABNORMAL LOW (ref 39.0–52.0)
HEMOGLOBIN: 8.9 g/dL — AB (ref 13.0–17.0)
MCH: 23.8 pg — AB (ref 26.0–34.0)
MCHC: 32.6 g/dL (ref 30.0–36.0)
MCV: 73 fL — ABNORMAL LOW (ref 78.0–100.0)
Platelets: 232 10*3/uL (ref 150–400)
RBC: 3.74 MIL/uL — ABNORMAL LOW (ref 4.22–5.81)
RDW: 18.2 % — ABNORMAL HIGH (ref 11.5–15.5)
WBC: 4.7 10*3/uL (ref 4.0–10.5)

## 2014-08-27 LAB — BASIC METABOLIC PANEL
Anion gap: 11 (ref 5–15)
BUN: 33 mg/dL — AB (ref 6–23)
CALCIUM: 8.8 mg/dL (ref 8.4–10.5)
CO2: 23 mEq/L (ref 19–32)
CREATININE: 1.63 mg/dL — AB (ref 0.50–1.35)
Chloride: 103 mEq/L (ref 96–112)
GFR calc non Af Amer: 37 mL/min — ABNORMAL LOW (ref >90)
GFR, EST AFRICAN AMERICAN: 42 mL/min — AB (ref >90)
Glucose, Bld: 100 mg/dL — ABNORMAL HIGH (ref 70–99)
Potassium: 4.8 mEq/L (ref 3.7–5.3)
Sodium: 137 mEq/L (ref 137–147)

## 2014-08-27 MED ORDER — HYDRALAZINE HCL 10 MG PO TABS
10.0000 mg | ORAL_TABLET | Freq: Three times a day (TID) | ORAL | Status: DC
  Administered 2014-08-27 – 2014-08-28 (×3): 10 mg via ORAL
  Filled 2014-08-27 (×10): qty 1

## 2014-08-27 MED ORDER — ISOSORBIDE MONONITRATE 15 MG HALF TABLET
15.0000 mg | ORAL_TABLET | Freq: Every day | ORAL | Status: DC
  Administered 2014-08-27 – 2014-08-28 (×2): 15 mg via ORAL
  Filled 2014-08-27 (×3): qty 1

## 2014-08-27 MED ORDER — ATORVASTATIN CALCIUM 40 MG PO TABS
40.0000 mg | ORAL_TABLET | Freq: Every day | ORAL | Status: DC
  Administered 2014-08-27 – 2014-08-28 (×2): 40 mg via ORAL
  Filled 2014-08-27 (×3): qty 1

## 2014-08-27 MED ORDER — CARVEDILOL 6.25 MG PO TABS
6.2500 mg | ORAL_TABLET | Freq: Two times a day (BID) | ORAL | Status: DC
  Administered 2014-08-27 – 2014-08-28 (×2): 6.25 mg via ORAL
  Filled 2014-08-27 (×7): qty 1

## 2014-08-27 NOTE — Evaluation (Signed)
Physical Therapy Evaluation Patient Details Name: Timothy Mayo MRN: [id number] DOB: 10-Sep-1925 Today's Date: 08/27/2014   History of Present Illness  80 y.o. male who presented with confusion in the setting of a questionable syncopal episode . PMH is significant for HTN, hypothyroidism, and anemia.  Clinical Impression  Pt completing O.T. Session on arrival. Pt with limited ability to follow commands or provide history. Pt reportedly agitated with OT and MD was calm during our limited interaction but not very willing to progress away from bed. Called dgtr with disconnected number and unable to accurately determine pt PLOF or assist at D/C. Will follow acutely for trial period to see if pt cognition improves to be able to benefit from therapy intervention for education, strengthening, safety and gait. Recommend 24hr assist and SNF at D/C with daily mobility under staff supervision.     Follow Up Recommendations SNF;Supervision/Assistance - 24 hour    Equipment Recommendations  None recommended by PT    Recommendations for Other Services       Precautions / Restrictions Precautions Precautions: Fall Restrictions Weight Bearing Restrictions: No      Mobility  Bed Mobility Overal bed mobility: Modified Independent             General bed mobility comments: Pt EOB with OT on arrival. With return to bed pt mod I  Transfers Overall transfer level: Needs assistance   Transfers: Sit to/from Stand Sit to Stand: Min guard Stand pivot transfers: Min assist       General transfer comment: visual and tactile cueing to initiate standing from bed. Pt wanting to look for clothes was only motivating factor for pt mobility at this time. Pt walked around end of bed. Then returned to bed without explanation or further conversation  Ambulation/Gait Ambulation/Gait assistance: Min guard Ambulation Distance (Feet): 8 Feet Assistive device: 1 person hand held assist Gait  Pattern/deviations: Step-through pattern;Decreased stride length   Gait velocity interpretation: Below normal speed for age/gender    Stairs            Wheelchair Mobility    Modified Rankin (Stroke Patients Only)       Balance Overall balance assessment: Needs assistance Sitting-balance support: Feet supported Sitting balance-Leahy Scale: Fair     Standing balance support: During functional activity Standing balance-Leahy Scale: Poor                               Pertinent Vitals/Pain Pain Assessment: No/denies pain Pain Score: 0-No pain    Home Living Family/patient expects to be discharged to:: Private residence Living Arrangements: Alone Available Help at Discharge: Family;Available PRN/intermittently Type of Home: House Home Access: Stairs to enter   [employer] CorporationEntrance Stairs-Number of Steps: 1 Home Layout: One level   Additional Comments: Pt stating he lives in a house, drives and telling OT he lives alone but reported to PT that he lives with a dgtr who works    Prior Function Level of Independence: Independent         Comments: per pt report independent and driving, question reliability and unable to contact dgtr via phone as house number disconnected     Hand Dominance   Dominant Hand: Right    Extremity/Trunk Assessment   Upper Extremity Assessment: Defer to OT evaluation           Lower Extremity Assessment: Generalized weakness;Difficult to assess due to impaired cognition (pt at least 3/5 as able to stand  but could not fully assess)      Cervical / Trunk Assessment: Other exceptions (forward head)  Communication   Communication: No difficulties  Cognition Arousal/Alertness: Awake/alert Behavior During Therapy: Flat affect;Restless Overall Cognitive Status: No family/caregiver present to determine baseline cognitive functioning Area of Impairment: Orientation;Attention;Memory;Safety/judgement;Following  commands;Awareness;Problem solving Orientation Level: Disoriented to;Place;Time;Situation Current Attention Level: Sustained Memory: Decreased short-term memory Following Commands: Follows one step commands inconsistently Safety/Judgement: Decreased awareness of safety;Decreased awareness of deficits       General Comments     Exercises        Assessment/Plan    PT Assessment Patient needs continued PT services  PT Diagnosis Abnormality of gait;Altered mental status   PT Problem List Decreased cognition;Decreased balance;Decreased safety awareness;Decreased knowledge of use of DME  PT Treatment Interventions DME instruction;Balance training;Gait training;Functional mobility training;Therapeutic activities;Patient/family education;Cognitive remediation   PT Goals (Current goals can be found in the Care Plan section) Acute Rehab PT Goals Patient Stated Goal: get my clothes PT Goal Formulation: With patient Time For Goal Achievement: 09/03/14 (trial) Potential to Achieve Goals: Fair    Frequency Min 2X/week, trial   Barriers to discharge Decreased caregiver support unclear of PLOF, assist at home and will trial therapy to determine baseline and potential therapy benefit    Co-evaluation               End of Session   Activity Tolerance: Other (comment) (pt limited by cognition and lack of following commands) Patient left: in bed;with call bell/phone within reach;with bed alarm set Nurse Communication: Mobility status;Precautions         Time: 1610-9604 PT Time Calculation (min): 7 min   Charges:   PT Evaluation $Initial PT Evaluation Tier I: 1 Procedure     PT G CodesDelorse Lek 08/27/2014, 9:54 AM Delaney Meigs, PT [phone]

## 2014-08-27 NOTE — Progress Notes (Signed)
Occupational Therapy Evaluation Patient Details Name: Timothy Mayo MRN: [id number] DOB: 1925/09/27 Today's Date: 08/27/2014    History of Present Illness 80 y.o. male who presented with confusion in the setting of a questionable syncopal episode . PMH is significant for HTN, hypothyroidism, and anemia.   Clinical Impression   Poor historian. No family available in room or by phone contact. Pt reports that he lives by himself adn that his daughter checks on him. States he was independent with ADL/IADL tasks PTA. Pt only oriented to self during evaluation. Apparently confused. Pt is NOT safe to D/C home alone. Unsure of baseline status. If this is baseline,  Pt will need 24/7 assistance if he is to D/C home. Recommend SNF if 24/7 care not available. No further acute OT needs. All further OT to be addressed in next venue of care.     Follow Up Recommendations  SNF;Supervision/Assistance - 24 hour    Equipment Recommendations  None recommended by OT    Recommendations for Other Services       Precautions / Restrictions Precautions Precautions: Fall Restrictions Weight Bearing Restrictions: No      Mobility Bed Mobility Overal bed mobility: Modified Independent                Transfers Overall transfer level: Needs assistance   Transfers: Sit to/from Stand;Stand Pivot Transfers Sit to Stand: Min assist Stand pivot transfers: Min assist            Balance Overall balance assessment: Needs assistance Sitting-balance support: Feet supported Sitting balance-Leahy Scale: Fair     Standing balance support: During functional activity Standing balance-Leahy Scale: Poor                              ADL Overall ADL's : Needs assistance/impaired Eating/Feeding: Set up   Grooming: Set up;Supervision/safety   Upper Body Bathing: Supervision/ safety;Set up;Sitting   Lower Body Bathing: Minimal assistance;Sit to/from stand           Toilet  Transfer: Minimal assistance   Toileting- Clothing Manipulation and Hygiene: Total assistance Toileting - Clothing Manipulation Details (indicate cue type and reason): nsg reprots pt was incontinent of BM/urine in floor last night     Functional mobility during ADLs: Minimal assistance General ADL Comments: impacted by impaired cognition; pt able to physically perform tasks, however, will not perform tasks due to apparent cognitivie deficits.     Vision                     Perception     Praxis      Pertinent Vitals/Pain Pain Assessment: Faces Pain Score: 0-No pain     Hand Dominance Right   Extremity/Trunk Assessment Upper Extremity Assessment Upper Extremity Assessment: Difficult to assess due to impaired cognition   Lower Extremity Assessment Lower Extremity Assessment: Defer to PT evaluation   Cervical / Trunk Assessment Cervical / Trunk Assessment: Other exceptions (forward head)   Communication Communication Communication: No difficulties   Cognition Arousal/Alertness: Awake/alert Behavior During Therapy: Flat affect;Restless Overall Cognitive Status: Impaired/Different from baseline Area of Impairment: Orientation;Attention;Memory;Safety/judgement;Following commands;Awareness;Problem solving Orientation Level: Disoriented to;Place;Time;Situation Current Attention Level: Sustained Memory: Decreased short-term memory;Decreased recall of precautions (poor immediate recall. no delayed recall) Following Commands: Follows one step commands with increased time;Follows one step commands inconsistently Safety/Judgement: Decreased awareness of safety;Decreased awareness of deficits Awareness: Intellectual Problem Solving: Slow processing;Decreased initiation;Difficulty sequencing;Requires verbal cues;Requires tactile cues  General Comments   Limited participation during eval. Pt with disheveled appearance; dirty nails; matted hair.     Exercises        Shoulder Instructions      Home Living Family/patient expects to be discharged to:: Private residence Living Arrangements: Alone Available Help at Discharge: Family;Available PRN/intermittently Type of Home: House Home Access: Stairs to enter [employer] of Steps: 1   Home Layout: One level                   Additional Comments: Pt would not give additional information about his home      Prior Functioning/Environment          Comments: unsure    OT Diagnosis: Generalized weakness;Cognitive deficits;Altered mental status   OT Problem List: Decreased strength;Decreased activity tolerance;Impaired balance (sitting and/or standing);Decreased cognition;Decreased safety awareness;Decreased knowledge of precautions   OT Treatment/Interventions:      OT Goals(Current goals can be found in the care plan section) Acute Rehab OT Goals Patient Stated Goal: to get my clothes on   OT Frequency:     Barriers to D/C:            Co-evaluation              End of Session Equipment Utilized During Treatment: Gait belt Nurse Communication: Mobility status  Activity Tolerance: Patient tolerated treatment well Patient left: Other (comment) (with PT)   Time: 9323-5573 OT Time Calculation (min): 25 min Charges:  OT General Charges $OT Visit: 1 Procedure OT Evaluation $Initial OT Evaluation Tier I: 1 Procedure OT Treatments $Self Care/Home Management : 8-22 mins G-Codes:    Tameia Rafferty,HILLARY September 25, 2014, 9:40 AM   Luisa Dago, OTR/L  [phone] 2014-09-25 Levada Bowersox, OTR/L  [phone] 25-Sep-2014

## 2014-08-27 NOTE — Progress Notes (Signed)
Family Medicine Teaching Service Daily Progress Note Intern Pager: [phone]  Patient name: Timothy Mayo Medical record number: [id number] Date of birth: 05/01/1925 Age: 80 y.o. Gender: male  Primary Care Provider: Vance Gather, MD Consultants: Cardiology, Social Work, PT, OT, Nutrition Code Status: Full  Pt Overview and Major Events to Date:  Timothy Mayo is a 80 y.o. male who presented with confusion in the setting of a questionable syncopal episode . PMH is significant for HTN, hypothyroidism, and anemia. Patient continues to be confused, likely delirium on undiagnosed dementia.  Assessment and Plan:  #Acute encephalopathy. Likely delirium on top of undiagnosed dementia. I suspect malnutrition has played a role given Pt's underweight BMI and cachectic appearance. Vascular dementia possible given head CT finding of small vessel ischemic changes, but no evidence of CVA. Hypothyroidism may contribute given elevated TSH, however Pt has normal total T4. Negative UDS. Electrolytes WNL. No signs of infection as Pt afebrile, no leukocytosis, normal UA. MMSE cannot be performed at this time as Pt non-cooperative.  - Continue synthroid 71mg/day - f/u with Pt's daughter to get better sense of his baseline - Continue close monitoring for clearing. PT recommending SNF placement.   # Protein-calorie malnutrition: patient with decreased albumin. BMI underweight at 16.4 and Pt has been eating voraciously here. Likely not being fed adequately at home. PT tried to see Pt yesterday but strict bed rest order was in and could not work with him. PT and OT saw today and say Pt needs SNF placement or 24/7 home supervision. - Nutrition consult   #sCHF: Echo done with EF 35-40%, severe LVH, grade 1 diastolic dysfunction. No signs of fluid overload on exam. - Hydralazine, Imdur, Coreg - Not starting ACEI at this time given renal function (see below)  #Elevated troponins.Troponins stable, elevation likely 2/2  CHF. EKG w/ NSR, prolonged QTc of 564. Pt kept pulling off his tele leads, so d/c'ed. Family does not wish to cath.  - Avoid QTc prolonging agents  #AKI on CKD. eGFR 43 c/w CKD Stage 3b. Creatinine 1.72 on admission, up from baseline 1.3 to 1.5. Likely prerenal secondary to decreased PO intake. SCr improved to 1.63 with fluids - 1/2 NS @ 100cc/hr  - Trend SCr w/ daily BMP    # Microcytic Anemia. Hgb 10.7 here, MCV 74.8. Baseline HgB 10-11.  Serum iron, ferritin, % sat WNL. RPI 0.3% indicating impaired marrow response. Has history of EGD showing gastritis and colonoscopy significant for internal/external hemorrhoids and heme positive stool in 2007. - outpatient follow-up with GI for microcytic anemia   #HTN. Continue home HCTZ and nifedipine. Added Coreg, hydralazine and Imdur as above for sCHF.  #Hypothyroid: TSH 21.74 but normal Total T4 7.6. Pt was off his Synthroid. - Restarted Synthroid as above  FEN/GI: Heart healthy diet PPx: SQ heparin  Disposition: Pending clinical improvement and SNF placement.   Subjective: Pt remains confused, pulling off his tele ledes and defecated on the floor yesterday. Pt has been eating his food voraciously. No complaints this morning, specifically denying SOB, chest pain, abdominal pain.  Objective: Temp:  [97.4 F (36.3 C)] 97.4 F (36.3 C) (09/28 2100) Pulse Rate:  [63-69] 69 (09/28 2100) Resp:  [18] 18 (09/28 2100) BP: (132-140)/(65-73) 132/65 mmHg (09/28 2100) SpO2:  [100 %] 100 % (09/28 2100) Physical Exam: General: cachectic appearing man lying in bed on side, confused but in no apparent distress Cardiovascular: RRR, no murmurs heard Respiratory: normal work of breathing Abdomen: soft, non-tender, non-distended Extremities: no LE edema  Psych: Oriented to self, not place or time.   Laboratory:  Recent Labs Lab 08/25/14 2329 08/26/14 0503 08/27/14 0547  WBC 3.6* 4.3 4.7  HGB 10.7* 10.3* 8.9*  HCT 33.6* 32.0* 27.3*  PLT 249  PLATELET CLUMPS NOTED ON SMEAR, UNABLE TO ESTIMATE 232    Recent Labs Lab 08/25/14 2329 08/26/14 0503 08/27/14 0547  NA 140 140 137  K 4.7 4.4 4.8  CL 106 106 103  CO2 _0 BUN 35* 33* 33*  CREATININE 1.72* 1.56* 1.63*  CALCIUM 9.0 9.0 8.8  PROT 6.1 5.9*  --   BILITOT 0.3 0.4  --   ALKPHOS 87 85  --   ALT 7 7  --   AST 17 18  --   GLUCOSE 112* 122* 100*   Troponins: 0.76 <-- 0.9 <-- 0.75 <-- 0.81  Imaging/Diagnostic Tests: CT head w/o contrast (9/27): small vessel ischemic changes, atrophy with secondary ventriculomegaly, otherwise no evidence of acute intracranial abnormality  [place]  I have seen and evaluated the above patient.  Addendum in blue. Physical exam as below:  General: cachectic but appears well, NAD.  Cardiovascular: RRR, no m/r/g. Respiratory: CTAB.  Extremities: no LE edema Psych: Oriented to self.  Not to place or time.  [place] PGY-3

## 2014-08-27 NOTE — Progress Notes (Signed)
CSW (Clinical Child psychotherapist) attempting to call pt daughter but unable to reach or leave voicemail with numbers provided on facesheet. CSW to continue to try to speak with pt daughter regarding dc plans.   Brentton Wardlow, LCSWA [phone]

## 2014-08-27 NOTE — Progress Notes (Signed)
SUBJECTIVE:  No compalints  OBJECTIVE:   Vitals:   Filed Vitals:   08/26/14 0300 08/26/14 0400 08/26/14 1444 08/26/14 2100  BP: 164/75 191/85 140/73 132/65  Pulse:  58 63 69  Temp:  97.6 F (36.4 C) 97.4 F (36.3 C) 97.4 F (36.3 C)  TempSrc:   Oral   Resp:  18 18 18   Height:  6' (1.829 m)    Weight:  121 lb (54.885 kg)    SpO2:  100% 100% 100%   I&O's:   Intake/Output Summary (Last 24 hours) at 08/27/14 0842 Last data filed at 08/26/14 2300  Gross per 24 hour  Intake    120 ml  Output      1 ml  Net    119 ml   TELEMETRY: Reviewed telemetry pt in NSR   PHYSICAL EXAM General: Well developed, well nourished, in no acute distress Head: Eyes PERRLA, No xanthomas.   Normal cephalic and atramatic  Lungs:   Clear bilaterally to auscultation and percussion. Heart:   HRRR S1 S2 Pulses are 2+ & equal.            No carotid bruit. No JVD.  No abdominal bruits. No femoral bruits. Abdomen: Bowel sounds are positive, abdomen soft and non-tender without masses  Extremities:   No clubbing, cyanosis or edema.  DP +1 Neuro: Alert and oriented X 3. Psych:  Good affect, responds appropriately   LABS: Basic Metabolic Panel:  Recent Labs  40/98/1109/28/15 0503 08/27/14 0547  NA 140 137  K 4.4 4.8  CL 106 103  CO2 21 23  GLUCOSE 122* 100*  BUN 33* 33*  CREATININE 1.56* 1.63*  CALCIUM 9.0 8.8   Liver Function Tests:  Recent Labs  08/25/14 2329 08/26/14 0503  AST 17 18  ALT 7 7  ALKPHOS 87 85  BILITOT 0.3 0.4  PROT 6.1 5.9*  ALBUMIN 2.9* 3.0*   No results found for this basename: LIPASE, AMYLASE,  in the last 72 hours CBC:  Recent Labs  08/25/14 2329 08/26/14 0503 08/27/14 0547  WBC 3.6* 4.3 4.7  NEUTROABS 2.4  --   --   HGB 10.7* 10.3* 8.9*  HCT 33.6* 32.0* 27.3*  MCV 74.8* 73.7* 73.0*  PLT 249 PLATELET CLUMPS NOTED ON SMEAR, UNABLE TO ESTIMATE 232   Cardiac Enzymes:  Recent Labs  08/26/14 0503 08/26/14 1037 08/26/14 1557  TROPONINI 0.75* 0.90* 0.76*     BNP: No components found with this basename: POCBNP,  D-Dimer: No results found for this basename: DDIMER,  in the last 72 hours Hemoglobin A1C:  Recent Labs  08/26/14 0503  HGBA1C 6.2*   Fasting Lipid Panel:  Recent Labs  08/26/14 0503  CHOL 226*  HDL 67  LDLCALC 144*  TRIG 75  CHOLHDL 3.4   Thyroid Function Tests:  Recent Labs  08/25/14 2329  TSH 21.740*  T4TOTAL 7.3   Anemia Panel:  Recent Labs  08/26/14 0503  VITAMINB12 584  FOLATE 6.8  FERRITIN 302  TIBC 171*  IRON 74  RETICCTPCT 0.6   Coag Panel:   No results found for this basename: INR, PROTIME    RADIOLOGY: Ct Head Wo Contrast  08/25/2014   CLINICAL DATA:  Confused, disoriented, right-sided weakness  EXAM: CT HEAD WITHOUT CONTRAST  TECHNIQUE: Contiguous axial images were obtained from the base of the skull through the vertex without intravenous contrast.  COMPARISON:  None.  FINDINGS: No evidence of parenchymal hemorrhage or extra-axial fluid collection. No mass lesion, mass  effect, or midline shift.  No CT evidence of acute infarction.  Subcortical white matter and periventricular small vessel ischemic changes. Intracranial atherosclerosis.  Global cortical atrophy.  Secondary ventricular prominence.  The visualized paranasal sinuses are essentially clear. The mastoid air cells are unopacified.  No evidence of calvarial fracture.  IMPRESSION: No evidence of acute intracranial abnormality.  Atrophy with secondary ventriculomegaly.  Small vessel ischemic changes with intracranial atherosclerosis.   Electronically Signed   By: Charline Bills M.D.   On: 08/25/2014 23:56   IMPRESSIONS:  1. Syncopal episode in an elderly male need to rule out arrhythmic event or neurologic event.  2D echo with moderate LV dysfunction EF 35-40%.  I spoke with his daughter yesterday and she did not want any invasive procedures done at this time. 2. Mild elevation of troponin - enzyme elevation minimal and not much of a  trend. Fairly flat bump in troponin that could be related to his fall in setting of renal insuff.  I do not think this represents a true NSTEMI.  3. Dementia  4. Hypertension - improved today 5. Hypothyroidism  6. History of medical noncompliance  7. Anemia   RECOMMENDATION:  1.  Telemetry monitoring for arrhythmia - would send home with 30 day heart monitor  2.  Continue ASA/statin and increase Lipitor to 40mg  daily for increased LDL 3.  Stop Nifedipine  4.  Add Coreg 6.25mg  BID for LV dysfunction 5.  Add Hydralazine 10mg  TID for afterload reduction 6.  No ACE I or ARB due to renal insuff 7.  Add Imdur 15mg  daily  for preload reduction    Quintella Reichert, MD  08/27/2014  8:42 AM

## 2014-08-27 NOTE — Progress Notes (Signed)
Seen and examined.  Agree with the documentation and management of Dr. Adriana Simas.  Still quite delirious.  We are not sure of his baseline - so we don't know how much is dementia versus delirium. Also, has protein calorie malnutrition - I wonder how attentive family has been.  Whatever, the likely dispo will be to SNF.  I want to watch at least another day to see some clearing.

## 2014-08-28 ENCOUNTER — Other Ambulatory Visit: Payer: Self-pay | Admitting: Family Medicine

## 2014-08-28 DIAGNOSIS — D649 Anemia, unspecified: Secondary | ICD-10-CM

## 2014-08-28 DIAGNOSIS — R4182 Altered mental status, unspecified: Secondary | ICD-10-CM

## 2014-08-28 LAB — BASIC METABOLIC PANEL
Anion gap: 10 (ref 5–15)
BUN: 34 mg/dL — AB (ref 6–23)
CO2: 24 meq/L (ref 19–32)
Calcium: 8.3 mg/dL — ABNORMAL LOW (ref 8.4–10.5)
Chloride: 101 mEq/L (ref 96–112)
Creatinine, Ser: 1.56 mg/dL — ABNORMAL HIGH (ref 0.50–1.35)
GFR calc Af Amer: 45 mL/min — ABNORMAL LOW (ref >90)
GFR, EST NON AFRICAN AMERICAN: 39 mL/min — AB (ref >90)
GLUCOSE: 88 mg/dL (ref 70–99)
Potassium: 4.5 mEq/L (ref 3.7–5.3)
SODIUM: 135 meq/L — AB (ref 137–147)

## 2014-08-28 LAB — CBC
HEMATOCRIT: 21.8 % — AB (ref 39.0–52.0)
Hemoglobin: 7.1 g/dL — ABNORMAL LOW (ref 13.0–17.0)
MCH: 23.7 pg — AB (ref 26.0–34.0)
MCHC: 32.6 g/dL (ref 30.0–36.0)
MCV: 72.9 fL — AB (ref 78.0–100.0)
PLATELETS: 205 10*3/uL (ref 150–400)
RBC: 2.99 MIL/uL — ABNORMAL LOW (ref 4.22–5.81)
RDW: 18.1 % — ABNORMAL HIGH (ref 11.5–15.5)
WBC: 3.9 10*3/uL — AB (ref 4.0–10.5)

## 2014-08-28 MED ORDER — HYDRALAZINE HCL 10 MG PO TABS
10.0000 mg | ORAL_TABLET | Freq: Three times a day (TID) | ORAL | Status: DC
Start: 2014-08-28 — End: 2014-10-21

## 2014-08-28 MED ORDER — ATORVASTATIN CALCIUM 40 MG PO TABS: 40.0000 mg | ORAL_TABLET | Freq: Every day | ORAL | Status: DC

## 2014-08-28 MED ORDER — ISOSORBIDE MONONITRATE 15 MG HALF TABLET: 15.0000 mg | ORAL_TABLET | Freq: Every day | ORAL | Status: DC

## 2014-08-28 MED ORDER — CARVEDILOL 6.25 MG PO TABS: 6.2500 mg | ORAL_TABLET | Freq: Two times a day (BID) | ORAL | Status: DC

## 2014-08-28 MED ORDER — ASPIRIN 81 MG PO TBEC
81.0000 mg | DELAYED_RELEASE_TABLET | Freq: Every day | ORAL | Status: DC
Start: 2014-08-28 — End: 2014-09-30

## 2014-08-28 NOTE — Discharge Instructions (Signed)
A future order has been placed to have your hemoglobin checked. Please go into the family medicine clinic within the next couple of days or early next week to have this checked. You can call to make a scheduled appointment in the lab anytime of the day. Please call [phone].   A hospital follow up has been made for next Friday. Please try to make this appointment. Please call and cancel if you are unable to make it.     Hypothyroidism The thyroid is a large gland located in the lower front of your neck. The thyroid gland helps control metabolism. Metabolism is how your body handles food. It controls metabolism with the hormone thyroxine. When this gland is underactive (hypothyroid), it produces too little hormone.  CAUSES These include:   Absence or destruction of thyroid tissue.  Goiter due to iodine deficiency.  Goiter due to medications.  Congenital defects (since birth).  Problems with the pituitary. This causes a lack of TSH (thyroid stimulating hormone). This hormone tells the thyroid to turn out more hormone. SYMPTOMS  Lethargy (feeling as though you have no energy)  Cold intolerance  Weight gain (in spite of normal food intake)  Dry skin  Coarse hair  Menstrual irregularity (if severe, may lead to infertility)  Slowing of thought processes Cardiac problems are also caused by insufficient amounts of thyroid hormone. Hypothyroidism in the newborn is cretinism, and is an extreme form. It is important that this form be treated adequately and immediately or it will lead rapidly to retarded physical and mental development. DIAGNOSIS  To prove hypothyroidism, your caregiver may do blood tests and ultrasound tests. Sometimes the signs are hidden. It may be necessary for your caregiver to watch this illness with blood tests either before or after diagnosis and treatment. TREATMENT  Low levels of thyroid hormone are increased by using synthetic thyroid hormone. This is a safe,  effective treatment. It usually takes about four weeks to gain the full effects of the medication. After you have the full effect of the medication, it will generally take another four weeks for problems to leave. Your caregiver may start you on low doses. If you have had heart problems the dose may be gradually increased. It is generally not an emergency to get rapidly to normal. HOME CARE INSTRUCTIONS   Take your medications as your caregiver suggests. Let your caregiver know of any medications you are taking or start taking. Your caregiver will help you with dosage schedules.  As your condition improves, your dosage needs may increase. It will be necessary to have continuing blood tests as suggested by your caregiver.  Report all suspected medication side effects to your caregiver. SEEK MEDICAL CARE IF: Seek medical care if you develop:  Sweating.  Tremulousness (tremors).  Anxiety.  Rapid weight loss.  Heat intolerance.  Emotional swings.  Diarrhea.  Weakness. SEEK IMMEDIATE MEDICAL CARE IF:  You develop chest pain, an irregular heart beat (palpitations), or a rapid heart beat. MAKE SURE YOU:   Understand these instructions.  Will watch your condition.  Will get help right away if you are not doing well or get worse. Document Released: 11/15/2005 Document Revised: 02/07/2012 Document Reviewed: 07/05/2008 [facility] Patient Information 2015 [place], [place]. This information is not intended to replace advice given to you by your health care provider. Make sure you discuss any questions you have with your health care provider.

## 2014-08-28 NOTE — Progress Notes (Signed)
Seen and examined. Agree with the management and documentation of Dr. Jordan Likes.  Delirium is clearing and patient is returning to what we think is his baseline - which is at least moderate dementia.  Family refusing recommended SNF and plans to take him home.  He does not require continued hospitalization but will need close outpatient FU to make sure the home environment is meeting his medical needs.

## 2014-08-28 NOTE — Progress Notes (Signed)
SUBJECTIVE:  No complaints  OBJECTIVE:   Vitals:   Filed Vitals:   08/26/14 2100 08/27/14 2104 08/28/14 0541 08/28/14 0900  BP: 132/65 135/78 118/59 126/60  Pulse: 69 76 67 65  Temp: 97.4 F (36.3 C) 97.9 F (36.6 C) 98.4 F (36.9 C) 99 F (37.2 C)  TempSrc: Oral Oral Oral Oral  Resp: 18 16 18 18   Height:      Weight:      SpO2: 100% 100% 100% 100%   I&O's:   Intake/Output Summary (Last 24 hours) at 08/28/14 0948 Last data filed at 08/28/14 0900  Gross per 24 hour  Intake      0 ml  Output      1 ml  Net     -1 ml   TELEMETRY: Reviewed telemetry pt in NSR:     PHYSICAL EXAM General: Well developed, well nourished, in no acute distress Head: Eyes PERRLA, No xanthomas.   Normal cephalic and atramatic  Lungs:   Clear bilaterally to auscultation and percussion. Heart:   HRRR S1 S2 Pulses are 2+ & equal. Abdomen: Bowel sounds are positive, abdomen soft and non-tender without masses  Extremities:   No clubbing, cyanosis or edema.  DP +1 Neuro: Alert and oriented X 3. Psych:  Good affect, responds appropriately   LABS: Basic Metabolic Panel:  Recent Labs  24/40/10 0547 08/28/14 0518  NA 137 135*  K 4.8 4.5  CL 103 101  CO2 23 24  GLUCOSE 100* 88  BUN 33* 34*  CREATININE 1.63* 1.56*  CALCIUM 8.8 8.3*   Liver Function Tests:  Recent Labs  08/25/14 2329 08/26/14 0503  AST 17 18  ALT 7 7  ALKPHOS 87 85  BILITOT 0.3 0.4  PROT 6.1 5.9*  ALBUMIN 2.9* 3.0*   No results found for this basename: LIPASE, AMYLASE,  in the last 72 hours CBC:  Recent Labs  08/25/14 2329  08/27/14 0547 08/28/14 0518  WBC 3.6*  < > 4.7 3.9*  NEUTROABS 2.4  --   --   --   HGB 10.7*  < > 8.9* 7.1*  HCT 33.6*  < > 27.3* 21.8*  MCV 74.8*  < > 73.0* 72.9*  PLT 249  < > 232 205  < > = values in this interval not displayed. Cardiac Enzymes:  Recent Labs  08/26/14 0503 08/26/14 1037 08/26/14 1557  TROPONINI 0.75* 0.90* 0.76*   BNP: No components found with this  basename: POCBNP,  D-Dimer: No results found for this basename: DDIMER,  in the last 72 hours Hemoglobin A1C:  Recent Labs  08/26/14 0503  HGBA1C 6.2*   Fasting Lipid Panel:  Recent Labs  08/26/14 0503  CHOL 226*  HDL 67  LDLCALC 144*  TRIG 75  CHOLHDL 3.4   Thyroid Function Tests:  Recent Labs  08/25/14 2329  TSH 21.740*  T4TOTAL 7.3   Anemia Panel:  Recent Labs  08/26/14 0503  VITAMINB12 584  FOLATE 6.8  FERRITIN 302  TIBC 171*  IRON 74  RETICCTPCT 0.6   Coag Panel:   No results found for this basename: INR, PROTIME    RADIOLOGY: Ct Head Wo Contrast  08/25/2014   CLINICAL DATA:  Confused, disoriented, right-sided weakness  EXAM: CT HEAD WITHOUT CONTRAST  TECHNIQUE: Contiguous axial images were obtained from the base of the skull through the vertex without intravenous contrast.  COMPARISON:  None.  FINDINGS: No evidence of parenchymal hemorrhage or extra-axial fluid collection. No mass lesion, mass effect,  or midline shift.  No CT evidence of acute infarction.  Subcortical white matter and periventricular small vessel ischemic changes. Intracranial atherosclerosis.  Global cortical atrophy.  Secondary ventricular prominence.  The visualized paranasal sinuses are essentially clear. The mastoid air cells are unopacified.  No evidence of calvarial fracture.  IMPRESSION: No evidence of acute intracranial abnormality.  Atrophy with secondary ventriculomegaly.  Small vessel ischemic changes with intracranial atherosclerosis.   Electronically Signed   By: Charline BillsSriyesh  Krishnan M.D.   On: 08/25/2014 23:56   IMPRESSIONS:  1. Syncopal episode in an elderly male need to rule out arrhythmic event or neurologic event. 2D echo with moderate LV dysfunction EF 35-40%. I spoke with his daughter and she did not want any invasive procedures done at this time.  2. Mild elevation of troponin - enzyme elevation minimal and not much of a trend. Fairly flat bump in troponin that could be  related to his fall in setting of renal insuff. I do not think this represents a true NSTEMI.  3. Dementia  4. Hypertension - BP controlled 5. Hypothyroidism  6. History of medical noncompliance  7. Anemia - per primary team - Hbg now down to 7.1  RECOMMENDATION:  1. Telemetry monitoring for arrhythmia - would send home with 30 day heart monitor  2. Continue ASA/statin.  Lipitor increased to 40mg  daily for increased LDL .  Will need followup FLP and ALT in 6 weeks 3. Continue Coreg and Hydralazine for LV dysfunction  4. No ACE I or ARB due to renal insuff  5. Continue  Imdur 15mg  daily for preload reduction   Quintella ReichertURNER,Velecia Ovitt R, MD  08/28/2014  9:48 AM

## 2014-08-28 NOTE — Progress Notes (Signed)
Patient is discharged. Attempted to call patient's daughter numerous times without any success.  Telephone number listed is disconnected.  Family medicine doctor on call notified.  MD also notified of patient's blood pressure 112/55 when it was very hypertensive yesterday.  MD stated to hold afternoon coreg.  Will continue to monitor.  Colman Cater

## 2014-08-28 NOTE — Discharge Summary (Signed)
Family Medicine Teaching Timothy Mayo Discharge Summary  Patient name: Timothy Mayo Medical record number: [id number] Date of birth: 1925-01-29 Age: 80 y.o. Gender: male Date of Admission: 08/25/2014  Date of Discharge: 08/28/2014 Admitting Physician: Timothy Barthel, MD  Primary Care Provider: Hazeline Junker, MD Consultants: Cardiology, Social Work, PT, OT, Nutrition  Indication for Hospitalization: Altered mental status  Discharge Diagnoses/Problem List:  - Protein-calorie malnutrition - Systolic CHF - CKD Stage 3b - Hypothyroidism - Chronic anemia - Hypertension - Hyperlipidemia - Likely undiagnosed dementia  Disposition: Home with Home Health services and Social Work follow-up. Family declined SNF.   Discharge Condition: Stable  Discharge Exam:  General: lying in bed, no apparent distress  Cardiovascular: RRR, no murmurs/rubs/gallops Respiratory: CTAB, normal work of breathing  Abdomen: soft, non-distended, non-tender, bowel sounds present Extremities: no LE edema  Psych: oriented to self only, not place or time  Brief Hospital Course:  Timothy Mayo is a 80 y.o. male who presented with AMS with questionable syncopal event. PMH is significant for HTN, HLD, CKD, hypothyroidism, and anemia.   #Acute encephalopathy. Likely multi-factorial delirium on top of undiagnosed dementia. We suspect malnutrition has played a role given patient's underweight BMI and cachectic appearance. Vascular dementia may contribute given head CT finding of small vessel ischemic changes. There was no evidence of CVA. There was no signs of infection, the patient remained afebrile without leukocytosis, and he had a normal urinalysis. His electrolytes were WNL. The patient remained only oriented to self during this hospitalization, but he remained calm and cooperative for the most part and accepted his medications. The patient should be medically optimized as below to prevent further episodes of  delirium on top of his likely undiagnosed dementia.  # Protein-calorie malnutrition: The patient appeared cachectic on admission and had an underweight BMI of 16.4. There was evidence of protein deficiency with an albumin of 2.9. Nutrition was consulted and determined the patient requires 1,800 - 2,000 kcal and 85- 100 grams of protein per day. Home Health has been set up to help the patient meet this nutritional requirements at home. Social Work will also follow-up with the patient as an outpatient to evaluate his is receiving adequate nutrition.  #Systolic CHF: The patient had an echocardiogram as he had a questionable syncopal event prior to admission. It revealed a reduced EF of 35-40%, severe LVH, and grade 1 diastolic dysfunction. The patient had no signs of fluid overload on exam. Cardiology started hydralazine, Imdur, and Coreg, and recommended not starting an ACE inhibitor/ARB due to the patient's CKD.  #AKI on CKD Stage 3b: Patient's creatinine was 1.72 on admission, up from his baseline of 1.3 to 1.5. Likely prerenal secondary to decreased PO intake given patient's cachectic state and the fact his creatinine improved to 1.63 with fluids. The patient's creatinine continued to trend down towards his baseline off fluids w/ good PO intake, with a serum creatinine of 1.56 on discharge.  # Chronic anemia: The patient has a chronic anemia with baseline Hg of 10-11. Iron studies showed normal serum iron, ferritin, % sat levels. His reticulocyte production index was 0.3% indicating impaired marrow response. The patient has a history of EGD showing gastritis and colonoscopy significant for internal/external hemorrhoids and heme positive stool in 2007. The patient remained hemodynamically stable during this admission and we recommend outpatient follow-up with GI to further work-up his chronic anemia.  #HTN: The patient was continued on his home HCTZ and nifedipine. Coreg, hydralazine and Imdur were added  as above  for his sCHF. Cardiology recommends not starting an ACE inhibitor/ARB due to the patient's CKD.  #Hypothyroidism: Patient had not been taking his Synthroid at home. His TSH was elevated at 21.74, however he had a normal total T4 at 7.6. He was restarted on his home dose of Synthroid, 25 mcg daily.  #Hyperlipidemia: Patient's Lipitor was increased to 40 mg daily as his LDL was elevated at 144. Cardiology recommends the patient have a follow-up fasting lipid panel and ALT checked in 6 weeks.  Issues for Follow Up:  1. Drop of Hgb on day of discharge. Future order CBC. Assess at follow up.  2. Assess if patient is receiving adequate nutritional intake. 3.  New diagnosis of systolic CHF. Patient has been started on hydralazine, Imdur, and Coreg. 4. Cardiology recommends the patient have a follow-up fasting lipid panel and ALT checked in 6 weeks as his Lipitor was increased.  Significant Procedures: Echocardiogram  Significant Labs and Imaging:   Recent Labs Lab 08/26/14 0503 08/27/14 0547 08/28/14 0518  WBC 4.3 4.7 3.9*  HGB 10.3* 8.9* 7.1*  HCT 32.0* 27.3* 21.8*  PLT PLATELET CLUMPS NOTED ON SMEAR, UNABLE TO ESTIMATE 232 205    Recent Labs Lab 08/25/14 2329 08/26/14 0503 08/27/14 0547 08/28/14 0518  NA 140 140 137 135*  K 4.7 4.4 4.8 4.5  CL 106 106 103 101  CO2 22 21 23 24   GLUCOSE 112* 122* 100* 88  BUN 35* 33* 33* 34*  CREATININE 1.72* 1.56* 1.63* 1.56*  CALCIUM 9.0 9.0 8.8 8.3*  ALKPHOS 87 85  --   --   AST 17 18  --   --   ALT 7 7  --   --   ALBUMIN 2.9* 3.0*  --   --      Recent Labs Lab 08/25/14 2329 08/26/14 0503 08/26/14 1037 08/26/14 1557  TROPONINI 0.81* 0.75* 0.90* 0.76*   CT head w/o contrast: No evidence of acute intracranial abnormality. Atrophy with secondary ventriculomegaly. Small vessel ischemic changes with intracranial atherosclerosis.  Echocardiogram: EF 35-40%, severe LVH, grade 1 diastolic dysfunction   Results/Tests Pending at  Time of Discharge: None  Discharge Medications:    Medication List    STOP taking these medications       NIFEdipine 90 MG 24 hr tablet  Commonly known as:  PROCARDIA XL/ADALAT-CC      TAKE these medications       aspirin 81 MG EC tablet  Take 1 tablet (81 mg total) by mouth daily.     atorvastatin 40 MG tablet  Commonly known as:  LIPITOR  Take 1 tablet (40 mg total) by mouth daily at 6 PM.     carvedilol 6.25 MG tablet  Commonly known as:  COREG  Take 1 tablet (6.25 mg total) by mouth 2 (two) times daily with a meal.     hydrALAZINE 10 MG tablet  Commonly known as:  APRESOLINE  Take 1 tablet (10 mg total) by mouth every 8 (eight) hours.     hydrochlorothiazide 25 MG tablet  Commonly known as:  HYDRODIURIL  Take 25 mg by mouth daily.     isosorbide mononitrate 15 mg Tb24 24 hr tablet  Commonly known as:  IMDUR  Take 0.5 tablets (15 mg total) by mouth daily.     levothyroxine 25 MCG tablet  Commonly known as:  SYNTHROID, LEVOTHROID  Take 25 mcg by mouth daily before breakfast.        Discharge Instructions: Please refer to  Patient Instructions section of EMR for full details.  Patient was counseled important signs and symptoms that should prompt return to medical care, changes in medications, dietary instructions, activity restrictions, and follow up appointments.   Follow-Up Appointments: Follow-up Information   Follow up with [facility]. [facility](HH services arranged- RN/PT/OT/aide/CSW)    Contact information:   [address] [place] [address] [phone]       Follow up with Araceli BoucheRumley, [place] N, DO On 09/06/2014. (hospital follow up, 2:00)    Specialty:  Family Medicine   Contact information:   [address] [place] [address] [phone]       Bernadette HoitSeth Congdon  08/28/2014, 4:32 PM Pager: [phone]  Upper Level Addendum:  I have seen and evaluated this patient along with MS Mr. Dionne MiloCongdon and reviewed the above note,  making necessary revisions in [facility].   Clare GandyJeremy Rochell Mabie, MD Family Medicine PGY-2

## 2014-08-28 NOTE — Progress Notes (Signed)
Clinical Social Work Department BRIEF PSYCHOSOCIAL ASSESSMENT 08/28/2014  Patient:  Timothy Mayo, Timothy Mayo     Account Number:  [id number]     Admit date:  08/25/2014  Clinical Social Worker:  Harless Nakayama  Date/Time:  08/28/2014 11:15 AM  Referred by:  Physician  Date Referred:  08/28/2014 Referred for  SNF Placement   Other Referral:   Interview type:  Family Other interview type:   Spoke with pt daughter at bedside    PSYCHOSOCIAL DATA Living Status:  WITH ADULT CHILDREN Admitted from facility:   Level of care:   Primary support name:  Kayde Hoggatt Primary support relationship to patient:  CHILD, ADULT Degree of support available:   Pt has good family support    CURRENT CONCERNS Current Concerns  Post-Acute Placement   Other Concerns:    SOCIAL WORK ASSESSMENT / PLAN CSW aware of PT recommendation and visited pt room to speak with pt daughter. Pt daugher informed CSW that pt lives with her and plan will be for pt to return home. Pt daughter not wanting to consider SNF at this time but is agreeable to home health services with [facility]. Pt daughter informed CSW she does not have any concerns and believes she can manage pt care at home.  CSW notified RNCM of pt daughter decision to have pt dc home. CSW requested that pt also have Child psychotherapist ordered with home health as there were some concerns for home situation voiced by nursing staff. CSW also paged MD to notify of [place]. At this time, pt has no further hospital social work needs. CSW signing off.   Assessment/plan status:  No Further Intervention Required Other assessment/ plan:   Information/referral to community resources:   SNF list denied    PATIENT'S/FAMILY'S RESPONSE TO PLAN OF CARE: Pt daughter wanting dc plan to be for home with home health services       Sharol Harness, Theresia Majors [phone]

## 2014-08-28 NOTE — Progress Notes (Signed)
Family Medicine Teaching Service Daily Progress Note Med Student Pager: [phone]  Patient name: Timothy Mayo Medical record number: [id number] Date of birth: [birth year] Age: 80 y.o. Gender: male  Primary Care Provider: Hazeline Junker, MD Consultants: Cardiology, SW, PT, OT, Nutrition Code Status: Full  Pt Overview and Major Events to Date:  9/28: admitted for confusion and near syncope. Echo done with EF 35-40%, severe LVH, grade 1 diastolic dysfunction 9/29: troponins stable, family doesn't want cath 9/30: family declined SNF placement  Assessment and Plan:  Timothy Mayo is a 80 y.o. male who presented with confusion in the setting of a questionable syncopal episode . PMH is significant for HTN, hypothyroidism, and anemia. Patient's confusion seems improved, working with PT/OT and accepting all medications. Likely resolving delirium on undiagnosed dementia.   #Acute encephalopathy. Likely delirium on top of undiagnosed dementia. I suspect malnutrition has played a role given Pt's underweight BMI and cachectic appearance. Vascular dementia possible given head CT finding of small vessel ischemic changes, but no evidence of CVA. Hypothyroidism may contribute given elevated TSH, however Pt has normal total T4. Negative UDS. Electrolytes WNL. No signs of infection as Pt afebrile, no leukocytosis, normal UA. - Continue synthroid 53mcg/day  - f/u with Pt's daughter to get better sense of his baseline   # Protein-calorie malnutrition: patient with decreased albumin. BMI underweight at 16.4 and Pt has been eating voraciously here. Likely not being fed adequately at home. PT tried to see Pt yesterday but strict bed rest order was in and could not work with him. PT and OT saw today and say Pt needs SNF placement or 24/7 home supervision.  - Nutrition and SW consult   #sCHF: Echo done with EF 35-40%, severe LVH, grade 1 diastolic dysfunction. No signs of fluid overload on exam.  - Hydralazine,  Imdur, Coreg  - No ACEI or ARB due to renal insufficiency   #Elevated troponins.Troponins trending down, elevation likely 2/2 CHF. EKG w/ NSR, prolonged QTc of 564. Pt kept pulling off his tele leads, so d/c'ed. Family does not wish to cath.  - Avoid QTc prolonging agents   #AKI on CKD. eGFR 43 c/w CKD Stage 3b. Creatinine 1.72 on admission, up from baseline 1.3 to 1.5. Likely prerenal secondary to decreased PO intake. SCr improved to 1.63 with fluids, and continues to trend down towards Pt's baseline off fluids w/ good PO intake. - Trend SCr w/ daily BMP - Avoid nephrotoxic agents  # Microcytic Anemia. Hgb 10.7 here, MCV 74.8. Baseline HgB 10-11. Serum iron, ferritin, % sat WNL. RPI 0.3% indicating impaired marrow response. Has history of EGD showing gastritis and colonoscopy significant for internal/external hemorrhoids and heme positive stool in 2007.  - Outpatient follow-up with GI for microcytic anemia   #HTN. Continue home HCTZ and nifedipine. Added Coreg, hydralazine and Imdur as above for sCHF.   #Hypothyroidism: TSH 21.74 but normal Total T4 (7.6). Pt was off his Synthroid.  - Restarted Synthroid as above   FEN/GI: Heart healthy diet  PPx: SQ heparin   Disposition: family refusing SNF, will dc with home health   Objective: Temp:  [97.9 F (36.6 C)-98.4 F (36.9 C)] 98.4 F (36.9 C) (09/30 0541) Pulse Rate:  [67-76] 67 (09/30 0541) Resp:  [16-18] 18 (09/30 0541) BP: (118-135)/(59-78) 118/59 mmHg (09/30 0541) SpO2:  [100 %] 100 % (09/30 0541) Physical Exam: General: lying in bed under the covers, no apparent distress Cardiovascular: RRR, no murmurs heard Respiratory: CTAB, normal work of breathing Abdomen: soft,  non-distended, non-tender Extremities: no LE edema Psych: oriented to self only, not place or time  Laboratory:  Recent Labs Lab 08/26/14 0503 08/27/14 0547 08/28/14 0518  WBC 4.3 4.7 3.9*  HGB 10.3* 8.9* 7.1*  HCT 32.0* 27.3* 21.8*  PLT PLATELET  CLUMPS NOTED ON SMEAR, UNABLE TO ESTIMATE 232 205    Recent Labs Lab 08/25/14 2329 08/26/14 0503 08/27/14 0547 08/28/14 0518  NA 140 140 137 135*  K 4.7 4.4 4.8 4.5  CL 106 106 103 101  CO2 $Re'22 21 23 24  'SXE$ BUN 35* 33* 33* 34*  CREATININE 1.72* 1.56* 1.63* 1.56*  CALCIUM 9.0 9.0 8.8 8.3*  PROT 6.1 5.9*  --   --   BILITOT 0.3 0.4  --   --   ALKPHOS 87 85  --   --   ALT 7 7  --   --   AST 17 18  --   --   GLUCOSE 112* 122* 100* 88     Recent Labs Lab 08/25/14 2329 08/26/14 0503 08/26/14 1037 08/26/14 1557  TROPONINI 0.81* 0.75* 0.90* 0.76*    Imaging/Diagnostic Tests: CT head w/o contrast (9/27): small vessel ischemic changes, atrophy with secondary ventriculomegaly, otherwise no evidence of acute intracranial abnormality   Cathleen Fears, Med Student 08/28/2014, 8:21 AM Pager: [phone], text pages welcome  Upper Level Addendum:  I have seen and evaluated this patient along with MS Mr. Floreen Comber and reviewed the above note, making necessary revisions in [facility].   General: lying in bed under the covers, no apparent distress Cardiovascular: RRR, no murmurs heard Respiratory: CTAB, normal work of breathing Abdomen: soft, non-distended, non-tender Extremities: no LE edema Neuro: only oriented to self, not oriented to month or year.   Clearance Coots, MD Family Medicine PGY-2

## 2014-08-28 NOTE — Discharge Summary (Signed)
Seen and examined. Agree with the discharge and with the management and documentation of Dr. Jordan Likes.  We would feel more comfortable if Timothy Mayo went to SNF at least temporarily.  Family has refused and wants to take him home.  We will send home health to follow up and make sure his home environment is safe and meeting his medical needs.

## 2014-08-28 NOTE — Care Management Note (Signed)
    Page 1 of 2   08/28/2014     11:46:26 AM CARE MANAGEMENT NOTE 08/28/2014  Patient:  GHALI, HUFFMAN   Account Number:  [id number]  Date Initiated:  08/28/2014  Documentation initiated by:  Donn Pierini  Subjective/Objective Assessment:   Pt admitted with AMS, syncope     Action/Plan:   PTA pt lived at home with daugher, PT/OT evals   Anticipated DC Date:  08/28/2014   Anticipated DC Plan:  HOME W HOME HEALTH SERVICES  In-house referral  Clinical Social Worker      DC Associate Professor  CM consult      [facility] Choice  HOME HEALTH   Choice offered to / List presented to:  C-4 Adult Children        HH arranged  HH-1 RN  HH-2 PT  HH-3 OT  HH-4 NURSE'S AIDE  HH-6 SOCIAL WORKER      HH agency  [facility]   Status of service:  Completed, signed off Medicare Important Message given?  YES (If response is "NO", the following Medicare IM given date fields will be blank) Date Medicare IM given:  08/28/2014 Medicare IM given by:  Donn Pierini Date Additional Medicare IM given:   Additional Medicare IM given by:    Discharge Disposition:  HOME W HOME HEALTH SERVICES  Per UR Regulation:  Reviewed for med. necessity/level of care/duration of stay  If discussed at Long Length of Stay Meetings, dates discussed:    Comments:  08/28/14- 1100- Donn Pierini RN, BSN [phone] PT/OT recommended STSNF - per CSW pt's daughter does not desire SNF for discharge and wants to use [facility] for [facility]- spoke with daughter- Luetta Nutting at bedside confirmed [facility] agency of choice- [facility]- daughter states pt does not need any DME for home- is independent and gets around without assist devices. Orders in chart for HH-RN/PT/OT/aide/CSW- referral made to Lupita Leash with [facility]- services to start within 24-48 hr post discharge.

## 2014-08-28 NOTE — Progress Notes (Signed)
Patient's hemoglobin 7.1 this am.  Results called to Dr. Jordan Likes, MD stated that patient would be followed up as an outpatient for repeat hemoglobin.  Plan for discharge today.  Colman Cater

## 2014-09-05 ENCOUNTER — Telehealth: Payer: Self-pay | Admitting: Family Medicine

## 2014-09-05 NOTE — Telephone Encounter (Signed)
[facility], you're scheduled to see him for hospital follow up tomorrow. I got this message of hypotension by home care RN. I'm just making sure you're aware. It may very well be that hydralazine needs to be lowered as it is the only antihypertensive he's on without mortality benefit. Please let me know if I can help in any way.

## 2014-09-05 NOTE — Telephone Encounter (Signed)
Nurse with [facility] called to inform provider that Timothy Mayo's bp was 70/42 w/o display of any symptoms.

## 2014-09-06 ENCOUNTER — Ambulatory Visit (INDEPENDENT_AMBULATORY_CARE_PROVIDER_SITE_OTHER): Payer: PRIVATE HEALTH INSURANCE | Admitting: Family Medicine

## 2014-09-06 ENCOUNTER — Encounter: Payer: Self-pay | Admitting: Family Medicine

## 2014-09-06 VITALS — BP 157/71 | HR 68 | Temp 98.7°F | Ht 72.0 in | Wt 131.0 lb

## 2014-09-06 DIAGNOSIS — I10 Essential (primary) hypertension: Secondary | ICD-10-CM

## 2014-09-06 DIAGNOSIS — H6123 Impacted cerumen, bilateral: Secondary | ICD-10-CM

## 2014-09-06 DIAGNOSIS — R413 Other amnesia: Secondary | ICD-10-CM

## 2014-09-06 DIAGNOSIS — D649 Anemia, unspecified: Secondary | ICD-10-CM

## 2014-09-06 LAB — CBC
HEMATOCRIT: 27 % — AB (ref 39.0–52.0)
HEMOGLOBIN: 8.5 g/dL — AB (ref 13.0–17.0)
MCH: 23.7 pg — AB (ref 26.0–34.0)
MCHC: 31.5 g/dL (ref 30.0–36.0)
MCV: 75.2 fL — ABNORMAL LOW (ref 78.0–100.0)
Platelets: 332 10*3/uL (ref 150–400)
RBC: 3.59 MIL/uL — ABNORMAL LOW (ref 4.22–5.81)
RDW: 20.2 % — AB (ref 11.5–15.5)
WBC: 5.1 10*3/uL (ref 4.0–10.5)

## 2014-09-06 MED ORDER — CARBAMIDE PEROXIDE 6.5 % OT SOLN: 5.0000 [drp] | Freq: Two times a day (BID) | OTIC | Status: AC

## 2014-09-06 NOTE — Patient Instructions (Signed)
Thank you so much for coming to visit me today! We'll be drawing some blood work today to check on his Anemia. Please come back in one month to check some more blood work. We will clean your ears out while you're here today!  Thanks again! Dr. Caroleen Hamman

## 2014-09-06 NOTE — Progress Notes (Signed)
Subjective:     Patient ID: Timothy Mayo, male   DOB: 1925/09/30, 80 y.o.   MRN: [id number]  HPI Timothy Mayo is an 80yo male presenting today for Hospital Follow-up.  Presented today with daughter who gave most of history.  # Hospital Follow-up - Hospitalized from 9/27 to 9/30 for Altered Mental Status. - Denies any further episodes of Altered Mental Status since discharge. - Daughter states he has been eating much better than he was prior to hospitalization - Hypotension reported by home care RN at 70/42 yesterday (09/05/14). Reported to be 110/58 this morning by home nurse. - Denies any further complaints, including chest pain, shortness of breath, dizziness, and orthopnea. - Noted to be anemic during hospitalization.  Denies dizziness.  Denies blood in stool. - Daughter states he has never been diagnosed with dementia. - Lives with his daughter - Able to dress himself, bathe himself, pretty independent at home - Diagnosed with CHF in hospital.  Denies problems with shortness of breath, orthopnea, or edema.  Review of Systems  Constitutional: Negative for activity change and appetite change.  HENT: Positive for hearing loss.   Respiratory: Negative for shortness of breath.   Cardiovascular: Negative for chest pain.  Gastrointestinal: Negative for abdominal pain and blood in stool.  Genitourinary: Negative for dysuria.  Neurological: Negative for dizziness.  All other systems reviewed and are negative.      Objective:   Physical Exam  Vitals reviewed. Constitutional: He appears well-developed and well-nourished. No distress.  HENT:  Head: Normocephalic and atraumatic.  TM not visualized due to hardened cerumen bilaterally  Eyes: Pupils are equal, round, and reactive to light.  Cardiovascular: Normal rate and regular rhythm.  Exam reveals no gallop and no friction rub.   No murmur heard. Pulmonary/Chest: Effort normal and breath sounds normal. No respiratory distress. He  has no wheezes. He has no rales.  Abdominal: Soft. Bowel sounds are normal. He exhibits no distension. There is no tenderness.  Musculoskeletal: He exhibits no edema.  Neurological: He is alert.  Oriented x 2 (Person and Place); Mini-Cog- did not remember any of the three objects after distraction and not able to draw a clock  Skin: He is not diaphoretic.  Psychiatric: He has a normal mood and affect.   Filed Vitals:   09/06/14 1417  BP: 157/71  Pulse: 68  Temp: 98.7 F (37.1 C)      Assessment:     Please refer to Problem List for Assessment.     Plan:     Please refer to Problem List for Plan.

## 2014-09-08 DIAGNOSIS — H6123 Impacted cerumen, bilateral: Secondary | ICD-10-CM | POA: Insufficient documentation

## 2014-09-08 MED ORDER — HYDROCHLOROTHIAZIDE 25 MG PO TABS: 13.5000 mg | ORAL_TABLET | Freq: Every day | ORAL | Status: AC

## 2014-09-08 NOTE — Assessment & Plan Note (Signed)
-   BP is widely labile. Reported to be 70/42 yesterday and 110/58 this morning. 157/71 in office.  Orthostatics showed laying down 130/79, sitting 109/65, and standing 112/68. - Currently controlled with HCTZ 25mg , Carvedilol 6.25mg  BID, and Hydralazine 10mg  q8hr - Goal is <150/90 - Carvedilol and Hydralazine + Isosorbide Mononitrate offer mortality benefits with CHF. Will not changes doses of these medications at this time. - Decrease HCTZ to 1/2 tablet per day (13.5mg )  -Recheck BP in one month.

## 2014-09-08 NOTE — Assessment & Plan Note (Signed)
-   Failed Mini-Cog. - Recommended following up at [facility].  Introduced to Dr. McDiarmid to promote future follow-up at clinic. - CT in hospital showed small vessel ischemic changes with intracranial atherosclerosis. Consider vascular dementia in DDx.

## 2014-09-08 NOTE — Assessment & Plan Note (Signed)
-   Hemoglobin was noted to be downtrending throughout hospitalization from 10.3 to 7.1. - CBC checked today to determine if hemoglobin was stable or continuing to decrease.  - Hemoglobin 8.5 today.  Continues to be anemic, but has increased since discharge. - Denies gross blood in stool. Given FOBT cards to take home.

## 2014-09-08 NOTE — Assessment & Plan Note (Signed)
-   Dose of Atorvastatin increased in hospital - Cardiology recommended checking Lipid Panel and ALT 6 weeks after discharge. Will check these at one month follow-up for blood pressure.

## 2014-09-08 NOTE — Assessment & Plan Note (Signed)
-   Decreased hearing can contribute to delirium episodes in elderly.   - Nursing attempted to clean ears in office without success. - Given prescription for Debrox.

## 2014-09-19 ENCOUNTER — Telehealth: Payer: Self-pay | Admitting: Family Medicine

## 2014-09-19 DIAGNOSIS — E118 Type 2 diabetes mellitus with unspecified complications: Secondary | ICD-10-CM

## 2014-09-19 NOTE — Telephone Encounter (Signed)
Spoke with patient's daughter and informed her of below 

## 2014-09-19 NOTE — Telephone Encounter (Signed)
Amy from [facility] called and needs a referral to [facility] to have his nails trimmed. Amy said that his toe nails are curling under his feet. Please call the daughter at [phone] to let them know when the appointment is. Myriam Jacobson

## 2014-09-30 ENCOUNTER — Other Ambulatory Visit: Payer: Self-pay | Admitting: Family Medicine

## 2014-10-03 ENCOUNTER — Ambulatory Visit (INDEPENDENT_AMBULATORY_CARE_PROVIDER_SITE_OTHER): Payer: PRIVATE HEALTH INSURANCE | Admitting: Podiatry

## 2014-10-03 ENCOUNTER — Telehealth: Payer: Self-pay | Admitting: Family Medicine

## 2014-10-03 ENCOUNTER — Ambulatory Visit (INDEPENDENT_AMBULATORY_CARE_PROVIDER_SITE_OTHER): Payer: PRIVATE HEALTH INSURANCE

## 2014-10-03 ENCOUNTER — Encounter: Payer: Self-pay | Admitting: Podiatry

## 2014-10-03 VITALS — BP 189/99 | HR 88 | Resp 16 | Ht 70.0 in | Wt 131.0 lb

## 2014-10-03 DIAGNOSIS — B351 Tinea unguium: Secondary | ICD-10-CM

## 2014-10-03 DIAGNOSIS — M79673 Pain in unspecified foot: Secondary | ICD-10-CM

## 2014-10-03 DIAGNOSIS — M2040 Other hammer toe(s) (acquired), unspecified foot: Secondary | ICD-10-CM

## 2014-10-03 DIAGNOSIS — E1142 Type 2 diabetes mellitus with diabetic polyneuropathy: Secondary | ICD-10-CM

## 2014-10-03 NOTE — Telephone Encounter (Signed)
lmovm asking Timothy Mayo to return call.  When she return call, please let her know we authorize 6 visits. Fleeger, Timothy Mayo

## 2014-10-03 NOTE — Telephone Encounter (Signed)
Amy from the [facility] called and would like to know how many visits they can have with patient? Please call Amy at [phone]. Myriam Jacobson

## 2014-10-03 NOTE — Progress Notes (Signed)
   Subjective:    Patient ID: Timothy Mayo, male    DOB: 07/08/1925, 80 y.o.   MRN: [id number]  HPI Comments: Pt presents for diabetic foot exam and debridement of extremely elongated, thickened 10 toenails.  Diabetes      Review of Systems  All other systems reviewed and are negative.      Objective:   Physical Exam: I have reviewed his past medical history medications allergy surgeries and social history. Pulses are barely palpable bilateral. Capillary fill time however is immediate. Feet are warm to the touch. Loss of tactile sensation her Semmes-Weinstein monofilament from the toes to the level of the mid metatarsal area. Deep tendon reflexes are intact bilateral muscle strength +5 over 5 dorsiflexors plantar flexors and inverters and everters all intrinsic musculature is intact. Orthopedic evaluation demonstrates all joints distal to the ankle before range of motion without crepitation. Hammertoe deformities are noted bilateral was severely elongated thickened yellow dystrophic likely mycotic and painful hallux and lesser nails bilateral bilaterally. These nails are neglected and have been for quite some times. No ulcerations and no lacerations to the skin.        Assessment & Plan:  Assessment: Pain in limb secondary to diabetic peripheral neuropathy and pain in limb second 2 onychomycosis bilateral.  Plan: Debridement of nails 1 through 5 bilateral covered service secondary to pain.

## 2014-10-07 NOTE — Telephone Encounter (Signed)
Timothy Mayo informed. Fleeger, Maryjo Rochester

## 2014-10-19 ENCOUNTER — Other Ambulatory Visit: Payer: Self-pay | Admitting: Family Medicine

## 2014-10-21 ENCOUNTER — Other Ambulatory Visit: Payer: Self-pay | Admitting: Family Medicine

## 2014-10-22 ENCOUNTER — Other Ambulatory Visit: Payer: Self-pay | Admitting: Family Medicine

## 2014-10-22 ENCOUNTER — Other Ambulatory Visit: Payer: Self-pay | Admitting: *Deleted

## 2014-10-22 MED ORDER — ISOSORBIDE MONONITRATE 15 MG HALF TABLET: 15.0000 mg | ORAL_TABLET | Freq: Every day | ORAL | Status: DC

## 2014-10-22 MED ORDER — ISOSORBIDE MONONITRATE 15 MG HALF TABLET: 15.0000 mg | ORAL_TABLET | Freq: Every day | ORAL | Status: AC

## 2014-12-06 ENCOUNTER — Other Ambulatory Visit: Payer: Self-pay | Admitting: Family Medicine

## 2015-01-07 ENCOUNTER — Ambulatory Visit: Payer: Medicaid Other | Admitting: Podiatry

## 2015-01-17 ENCOUNTER — Inpatient Hospital Stay (HOSPITAL_COMMUNITY)
Admission: EM | Admit: 2015-01-17 | Discharge: 2015-01-28 | DRG: 870 | Disposition: E | Payer: Medicare Other | Attending: Pulmonary Disease | Admitting: Pulmonary Disease

## 2015-01-17 ENCOUNTER — Inpatient Hospital Stay (HOSPITAL_COMMUNITY): Payer: Medicare Other

## 2015-01-17 ENCOUNTER — Encounter (HOSPITAL_COMMUNITY): Payer: Self-pay

## 2015-01-17 ENCOUNTER — Emergency Department (HOSPITAL_COMMUNITY): Payer: Medicare Other

## 2015-01-17 DIAGNOSIS — I469 Cardiac arrest, cause unspecified: Secondary | ICD-10-CM | POA: Diagnosis not present

## 2015-01-17 DIAGNOSIS — Z66 Do not resuscitate: Secondary | ICD-10-CM | POA: Diagnosis present

## 2015-01-17 DIAGNOSIS — J189 Pneumonia, unspecified organism: Secondary | ICD-10-CM | POA: Diagnosis not present

## 2015-01-17 DIAGNOSIS — Z9889 Other specified postprocedural states: Secondary | ICD-10-CM

## 2015-01-17 DIAGNOSIS — J1000 Influenza due to other identified influenza virus with unspecified type of pneumonia: Secondary | ICD-10-CM | POA: Diagnosis present

## 2015-01-17 DIAGNOSIS — N170 Acute kidney failure with tubular necrosis: Secondary | ICD-10-CM | POA: Diagnosis present

## 2015-01-17 DIAGNOSIS — G9340 Encephalopathy, unspecified: Secondary | ICD-10-CM | POA: Diagnosis present

## 2015-01-17 DIAGNOSIS — J9601 Acute respiratory failure with hypoxia: Secondary | ICD-10-CM

## 2015-01-17 DIAGNOSIS — N19 Unspecified kidney failure: Secondary | ICD-10-CM | POA: Diagnosis not present

## 2015-01-17 DIAGNOSIS — R001 Bradycardia, unspecified: Secondary | ICD-10-CM | POA: Diagnosis not present

## 2015-01-17 DIAGNOSIS — D638 Anemia in other chronic diseases classified elsewhere: Secondary | ICD-10-CM | POA: Diagnosis present

## 2015-01-17 DIAGNOSIS — Z7982 Long term (current) use of aspirin: Secondary | ICD-10-CM | POA: Diagnosis not present

## 2015-01-17 DIAGNOSIS — I480 Paroxysmal atrial fibrillation: Secondary | ICD-10-CM | POA: Diagnosis not present

## 2015-01-17 DIAGNOSIS — J939 Pneumothorax, unspecified: Secondary | ICD-10-CM | POA: Diagnosis not present

## 2015-01-17 DIAGNOSIS — I272 Other secondary pulmonary hypertension: Secondary | ICD-10-CM | POA: Diagnosis present

## 2015-01-17 DIAGNOSIS — R319 Hematuria, unspecified: Secondary | ICD-10-CM | POA: Diagnosis not present

## 2015-01-17 DIAGNOSIS — R579 Shock, unspecified: Secondary | ICD-10-CM

## 2015-01-17 DIAGNOSIS — I420 Dilated cardiomyopathy: Secondary | ICD-10-CM

## 2015-01-17 DIAGNOSIS — R109 Unspecified abdominal pain: Secondary | ICD-10-CM

## 2015-01-17 DIAGNOSIS — J90 Pleural effusion, not elsewhere classified: Secondary | ICD-10-CM

## 2015-01-17 DIAGNOSIS — J9600 Acute respiratory failure, unspecified whether with hypoxia or hypercapnia: Secondary | ICD-10-CM

## 2015-01-17 DIAGNOSIS — I4891 Unspecified atrial fibrillation: Secondary | ICD-10-CM | POA: Diagnosis present

## 2015-01-17 DIAGNOSIS — G9341 Metabolic encephalopathy: Secondary | ICD-10-CM | POA: Diagnosis present

## 2015-01-17 DIAGNOSIS — J95811 Postprocedural pneumothorax: Secondary | ICD-10-CM | POA: Diagnosis not present

## 2015-01-17 DIAGNOSIS — Z9689 Presence of other specified functional implants: Secondary | ICD-10-CM

## 2015-01-17 DIAGNOSIS — I5043 Acute on chronic combined systolic (congestive) and diastolic (congestive) heart failure: Secondary | ICD-10-CM | POA: Diagnosis present

## 2015-01-17 DIAGNOSIS — E46 Unspecified protein-calorie malnutrition: Secondary | ICD-10-CM | POA: Diagnosis present

## 2015-01-17 DIAGNOSIS — F0390 Unspecified dementia without behavioral disturbance: Secondary | ICD-10-CM | POA: Diagnosis present

## 2015-01-17 DIAGNOSIS — I129 Hypertensive chronic kidney disease with stage 1 through stage 4 chronic kidney disease, or unspecified chronic kidney disease: Secondary | ICD-10-CM | POA: Diagnosis present

## 2015-01-17 DIAGNOSIS — R6521 Severe sepsis with septic shock: Secondary | ICD-10-CM | POA: Diagnosis present

## 2015-01-17 DIAGNOSIS — E035 Myxedema coma: Secondary | ICD-10-CM | POA: Diagnosis present

## 2015-01-17 DIAGNOSIS — E11649 Type 2 diabetes mellitus with hypoglycemia without coma: Secondary | ICD-10-CM | POA: Diagnosis present

## 2015-01-17 DIAGNOSIS — R4182 Altered mental status, unspecified: Secondary | ICD-10-CM | POA: Diagnosis present

## 2015-01-17 DIAGNOSIS — E872 Acidosis: Secondary | ICD-10-CM | POA: Diagnosis present

## 2015-01-17 DIAGNOSIS — I248 Other forms of acute ischemic heart disease: Secondary | ICD-10-CM | POA: Diagnosis present

## 2015-01-17 DIAGNOSIS — Z87891 Personal history of nicotine dependence: Secondary | ICD-10-CM | POA: Diagnosis not present

## 2015-01-17 DIAGNOSIS — R188 Other ascites: Secondary | ICD-10-CM | POA: Diagnosis present

## 2015-01-17 DIAGNOSIS — N183 Chronic kidney disease, stage 3 (moderate): Secondary | ICD-10-CM | POA: Diagnosis present

## 2015-01-17 DIAGNOSIS — Z9289 Personal history of other medical treatment: Secondary | ICD-10-CM

## 2015-01-17 DIAGNOSIS — Y848 Other medical procedures as the cause of abnormal reaction of the patient, or of later complication, without mention of misadventure at the time of the procedure: Secondary | ICD-10-CM | POA: Diagnosis not present

## 2015-01-17 DIAGNOSIS — A419 Sepsis, unspecified organism: Secondary | ICD-10-CM | POA: Diagnosis present

## 2015-01-17 LAB — I-STAT CHEM 8, ED
BUN: 71 mg/dL — ABNORMAL HIGH (ref 6–23)
CALCIUM ION: 1.12 mmol/L — AB (ref 1.13–1.30)
CHLORIDE: 105 mmol/L (ref 96–112)
Creatinine, Ser: 2.3 mg/dL — ABNORMAL HIGH (ref 0.50–1.35)
GLUCOSE: 100 mg/dL — AB (ref 70–99)
HCT: 29 % — ABNORMAL LOW (ref 39.0–52.0)
Hemoglobin: 9.9 g/dL — ABNORMAL LOW (ref 13.0–17.0)
Potassium: 4.7 mmol/L (ref 3.5–5.1)
SODIUM: 137 mmol/L (ref 135–145)
TCO2: 21 mmol/L (ref 0–100)

## 2015-01-17 LAB — CBC WITH DIFFERENTIAL/PLATELET
BASOS ABS: 0 10*3/uL (ref 0.0–0.1)
BASOS PCT: 0 % (ref 0–1)
BASOS PCT: 0 % (ref 0–1)
Basophils Absolute: 0 10*3/uL (ref 0.0–0.1)
EOS ABS: 0 10*3/uL (ref 0.0–0.7)
Eosinophils Absolute: 0 10*3/uL (ref 0.0–0.7)
Eosinophils Relative: 0 % (ref 0–5)
Eosinophils Relative: 0 % (ref 0–5)
HCT: 25.8 % — ABNORMAL LOW (ref 39.0–52.0)
HEMATOCRIT: 23 % — AB (ref 39.0–52.0)
Hemoglobin: 8.3 g/dL — ABNORMAL LOW (ref 13.0–17.0)
Hemoglobin: 7.5 g/dL — ABNORMAL LOW (ref 13.0–17.0)
LYMPHS PCT: 9 % — AB (ref 12–46)
Lymphocytes Relative: 9 % — ABNORMAL LOW (ref 12–46)
Lymphs Abs: 0.3 10*3/uL — ABNORMAL LOW (ref 0.7–4.0)
Lymphs Abs: 0.3 10*3/uL — ABNORMAL LOW (ref 0.7–4.0)
MCH: 24.8 pg — ABNORMAL LOW (ref 26.0–34.0)
MCH: 24.7 pg — ABNORMAL LOW (ref 26.0–34.0)
MCHC: 32.2 g/dL (ref 30.0–36.0)
MCHC: 32.6 g/dL (ref 30.0–36.0)
MCV: 77 fL — AB (ref 78.0–100.0)
MCV: 75.7 fL — AB (ref 78.0–100.0)
MONO ABS: 0.2 10*3/uL (ref 0.1–1.0)
MONOS PCT: 6 % (ref 3–12)
Monocytes Absolute: 0.2 10*3/uL (ref 0.1–1.0)
Monocytes Relative: 4 % (ref 3–12)
NEUTROS ABS: 3.3 10*3/uL (ref 1.7–7.7)
Neutro Abs: 3 10*3/uL (ref 1.7–7.7)
Neutrophils Relative %: 87 % — ABNORMAL HIGH (ref 43–77)
Neutrophils Relative %: 85 % — ABNORMAL HIGH (ref 43–77)
Platelets: 133 10*3/uL — ABNORMAL LOW (ref 150–400)
Platelets: 100 10*3/uL — ABNORMAL LOW (ref 150–400)
RBC: 3.35 MIL/uL — AB (ref 4.22–5.81)
RBC: 3.04 MIL/uL — ABNORMAL LOW (ref 4.22–5.81)
RDW: 22.2 % — ABNORMAL HIGH (ref 11.5–15.5)
RDW: 22.2 % — AB (ref 11.5–15.5)
SMEAR REVIEW: DECREASED
WBC: 3.8 10*3/uL — AB (ref 4.0–10.5)
WBC: 3.5 10*3/uL — ABNORMAL LOW (ref 4.0–10.5)

## 2015-01-17 LAB — I-STAT ARTERIAL BLOOD GAS, ED
ACID-BASE DEFICIT: 2 mmol/L (ref 0.0–2.0)
Bicarbonate: 22.5 mEq/L (ref 20.0–24.0)
O2 SAT: 100 %
PCO2 ART: 28.6 mmHg — AB (ref 35.0–45.0)
PO2 ART: 528 mmHg — AB (ref 80.0–100.0)
Patient temperature: 33.2
TCO2: 24 mmol/L (ref 0–100)
pH, Arterial: 7.488 — ABNORMAL HIGH (ref 7.350–7.450)

## 2015-01-17 LAB — I-STAT VENOUS BLOOD GAS, ED
ACID-BASE DEFICIT: 19 mmol/L — AB (ref 0.0–2.0)
Bicarbonate: 8.5 mEq/L — ABNORMAL LOW (ref 20.0–24.0)
O2 Saturation: 56 %
TCO2: 9 mmol/L (ref 0–100)
pCO2, Ven: 21.6 mmHg — ABNORMAL LOW (ref 45.0–50.0)
pH, Ven: 7.175 — CL (ref 7.250–7.300)
pO2, Ven: 27 mmHg — CL (ref 30.0–45.0)

## 2015-01-17 LAB — ABO/RH: ABO/RH(D): O NEG

## 2015-01-17 LAB — COMPREHENSIVE METABOLIC PANEL
ALK PHOS: 148 U/L — AB (ref 39–117)
ALT: 79 U/L — ABNORMAL HIGH (ref 0–53)
ALT: 90 U/L — AB (ref 0–53)
ANION GAP: 8 (ref 5–15)
AST: 148 U/L — ABNORMAL HIGH (ref 0–37)
AST: 170 U/L — ABNORMAL HIGH (ref 0–37)
Albumin: 2.8 g/dL — ABNORMAL LOW (ref 3.5–5.2)
Albumin: 2.7 g/dL — ABNORMAL LOW (ref 3.5–5.2)
Alkaline Phosphatase: 141 U/L — ABNORMAL HIGH (ref 39–117)
Anion gap: 5 (ref 5–15)
BILIRUBIN TOTAL: 0.9 mg/dL (ref 0.3–1.2)
BILIRUBIN TOTAL: 1.2 mg/dL (ref 0.3–1.2)
BUN: 61 mg/dL — AB (ref 6–23)
BUN: 56 mg/dL — ABNORMAL HIGH (ref 6–23)
CHLORIDE: 107 mmol/L (ref 96–112)
CO2: 22 mmol/L (ref 19–32)
CO2: 26 mmol/L (ref 19–32)
CREATININE: 2.39 mg/dL — AB (ref 0.50–1.35)
Calcium: 8.3 mg/dL — ABNORMAL LOW (ref 8.4–10.5)
Calcium: 8.3 mg/dL — ABNORMAL LOW (ref 8.4–10.5)
Chloride: 102 mmol/L (ref 96–112)
Creatinine, Ser: 2.32 mg/dL — ABNORMAL HIGH (ref 0.50–1.35)
GFR calc non Af Amer: 24 mL/min — ABNORMAL LOW (ref >90)
GFR, EST AFRICAN AMERICAN: 26 mL/min — AB (ref >90)
GFR, EST AFRICAN AMERICAN: 27 mL/min — AB (ref >90)
GFR, EST NON AFRICAN AMERICAN: 23 mL/min — AB (ref >90)
GLUCOSE: 105 mg/dL — AB (ref 70–99)
GLUCOSE: 133 mg/dL — AB (ref 70–99)
POTASSIUM: 4.4 mmol/L (ref 3.5–5.1)
Potassium: 4.3 mmol/L (ref 3.5–5.1)
Sodium: 137 mmol/L (ref 135–145)
Sodium: 133 mmol/L — ABNORMAL LOW (ref 135–145)
TOTAL PROTEIN: 5.1 g/dL — AB (ref 6.0–8.3)
Total Protein: 5.2 g/dL — ABNORMAL LOW (ref 6.0–8.3)

## 2015-01-17 LAB — MAGNESIUM: Magnesium: 1.9 mg/dL (ref 1.5–2.5)

## 2015-01-17 LAB — TYPE AND SCREEN
ABO/RH(D): O NEG
ANTIBODY SCREEN: NEGATIVE

## 2015-01-17 LAB — TROPONIN I
TROPONIN I: 0.08 ng/mL — AB (ref <0.031)
TROPONIN I: 0.08 ng/mL — AB (ref <0.031)
TROPONIN I: 0.08 ng/mL — AB (ref <0.031)

## 2015-01-17 LAB — GLUCOSE, CAPILLARY: GLUCOSE-CAPILLARY: 126 mg/dL — AB (ref 70–99)

## 2015-01-17 LAB — CARBOXYHEMOGLOBIN
CARBOXYHEMOGLOBIN: 0.7 % (ref 0.5–1.5)
METHEMOGLOBIN: 0.9 % (ref 0.0–1.5)
O2 SAT: 55.4 %
TOTAL HEMOGLOBIN: 7.5 g/dL — AB (ref 13.5–18.0)

## 2015-01-17 LAB — I-STAT CG4 LACTIC ACID, ED: LACTIC ACID, VENOUS: 2.17 mmol/L — AB (ref 0.5–2.0)

## 2015-01-17 LAB — LACTIC ACID, PLASMA: Lactic Acid, Venous: 1.8 mmol/L (ref 0.5–2.0)

## 2015-01-17 LAB — TSH: TSH: 78.43 u[IU]/mL — ABNORMAL HIGH (ref 0.350–4.500)

## 2015-01-17 LAB — PHOSPHORUS: PHOSPHORUS: 3.2 mg/dL (ref 2.3–4.6)

## 2015-01-17 LAB — FIBRINOGEN: Fibrinogen: 299 mg/dL (ref 204–475)

## 2015-01-17 LAB — MRSA PCR SCREENING: MRSA by PCR: NEGATIVE

## 2015-01-17 LAB — APTT: aPTT: 30 seconds (ref 24–37)

## 2015-01-17 LAB — PROTIME-INR
INR: 1.24 (ref 0.00–1.49)
Prothrombin Time: 15.7 seconds — ABNORMAL HIGH (ref 11.6–15.2)

## 2015-01-17 MED ORDER — SODIUM CHLORIDE 0.9 % IV SOLN
INTRAVENOUS | Status: DC
  Administered 2015-01-18 – 2015-01-19 (×2): via INTRAVENOUS

## 2015-01-17 MED ORDER — FENTANYL CITRATE 0.05 MG/ML IJ SOLN
INTRAMUSCULAR | Status: AC
  Filled 2015-01-17: qty 2

## 2015-01-17 MED ORDER — LIDOCAINE HCL (CARDIAC) 20 MG/ML IV SOLN
INTRAVENOUS | Status: AC
  Filled 2015-01-17: qty 5

## 2015-01-17 MED ORDER — DEXTROSE 5 % IV SOLN: 2.0000 g | Freq: Once | INTRAVENOUS | Status: DC

## 2015-01-17 MED ORDER — SODIUM CHLORIDE 0.9 % IV BOLUS (SEPSIS)
1000.0000 mL | Freq: Once | INTRAVENOUS | Status: AC
  Administered 2015-01-17: 1000 mL via INTRAVENOUS

## 2015-01-17 MED ORDER — PIPERACILLIN-TAZOBACTAM 3.375 G IVPB 30 MIN
3.3750 g | Freq: Once | INTRAVENOUS | Status: AC
  Administered 2015-01-17: 3.375 g via INTRAVENOUS
  Filled 2015-01-17: qty 50

## 2015-01-17 MED ORDER — FENTANYL CITRATE 0.05 MG/ML IJ SOLN
50.0000 ug | Freq: Once | INTRAMUSCULAR | Status: AC
  Administered 2015-01-17: 50 ug via INTRAVENOUS

## 2015-01-17 MED ORDER — LEVOTHYROXINE SODIUM 50 MCG PO TABS
50.0000 ug | ORAL_TABLET | Freq: Every day | ORAL | Status: DC
  Administered 2015-01-18: 50 ug via ORAL
  Filled 2015-01-17 (×2): qty 1

## 2015-01-17 MED ORDER — EPINEPHRINE HCL 1 MG/ML IJ SOLN
0.5000 ug/min | Freq: Once | INTRAVENOUS | Status: AC
  Administered 2015-01-17: 2 ug/min via INTRAVENOUS
  Filled 2015-01-17: qty 4

## 2015-01-17 MED ORDER — CETYLPYRIDINIUM CHLORIDE 0.05 % MT LIQD
7.0000 mL | Freq: Four times a day (QID) | OROMUCOSAL | Status: DC
  Administered 2015-01-18 – 2015-01-21 (×16): 7 mL via OROMUCOSAL

## 2015-01-17 MED ORDER — AZITHROMYCIN 500 MG IV SOLR
500.0000 mg | INTRAVENOUS | Status: DC
  Administered 2015-01-17 – 2015-01-18 (×2): 500 mg via INTRAVENOUS
  Filled 2015-01-17 (×4): qty 500

## 2015-01-17 MED ORDER — ATROPINE SULFATE 1 MG/ML IJ SOLN
INTRAMUSCULAR | Status: AC | PRN
  Administered 2015-01-17: 1 mg via INTRAVENOUS

## 2015-01-17 MED ORDER — PHENYLEPHRINE 200 MCG/ML FOR PRIAPISM / HYPOTENSION
50.0000 ug | Freq: Once | INTRAMUSCULAR | Status: AC
Start: 2015-01-17 — End: 2015-01-17
  Administered 2015-01-17: 50 ug via INTRAVENOUS
  Filled 2015-01-17: qty 50

## 2015-01-17 MED ORDER — SUCCINYLCHOLINE CHLORIDE 20 MG/ML IJ SOLN
INTRAMUSCULAR | Status: DC | PRN
  Administered 2015-01-17: 100 mg via INTRAVENOUS

## 2015-01-17 MED ORDER — HYDROCORTISONE NA SUCCINATE PF 100 MG IJ SOLR
50.0000 mg | Freq: Four times a day (QID) | INTRAMUSCULAR | Status: DC
  Administered 2015-01-17 – 2015-01-25 (×33): 50 mg via INTRAVENOUS
  Filled 2015-01-17 (×36): qty 1

## 2015-01-17 MED ORDER — PHENYLEPHRINE 200 MCG/ML FOR PRIAPISM / HYPOTENSION
50.0000 ug | Freq: Once | INTRAMUSCULAR | Status: AC
  Administered 2015-01-17: 50 ug via INTRAVENOUS
  Filled 2015-01-17: qty 50

## 2015-01-17 MED ORDER — PHENYLEPHRINE 200 MCG/ML FOR PRIAPISM / HYPOTENSION
50.0000 ug | Freq: Once | INTRAMUSCULAR | Status: AC
  Filled 2015-01-17: qty 50

## 2015-01-17 MED ORDER — EPINEPHRINE HCL 1 MG/ML IJ SOLN
0.5000 ug/min | INTRAVENOUS | Status: DC
  Administered 2015-01-17: 8 ug/min via INTRAVENOUS
  Filled 2015-01-17 (×2): qty 4

## 2015-01-17 MED ORDER — VANCOMYCIN HCL IN DEXTROSE 1-5 GM/200ML-% IV SOLN
1000.0000 mg | Freq: Once | INTRAVENOUS | Status: AC
  Administered 2015-01-17: 1000 mg via INTRAVENOUS
  Filled 2015-01-17: qty 200

## 2015-01-17 MED ORDER — SODIUM CHLORIDE 0.9 % IV SOLN
INTRAVENOUS | Status: DC
  Administered 2015-01-18: 01:00:00 via INTRAVENOUS

## 2015-01-17 MED ORDER — FENTANYL CITRATE 0.05 MG/ML IJ SOLN: 100.0000 ug | INTRAMUSCULAR | Status: DC | PRN

## 2015-01-17 MED ORDER — CHLORHEXIDINE GLUCONATE 0.12 % MT SOLN
15.0000 mL | Freq: Two times a day (BID) | OROMUCOSAL | Status: DC
  Administered 2015-01-17 – 2015-01-24 (×14): 15 mL via OROMUCOSAL
  Filled 2015-01-17 (×13): qty 15

## 2015-01-17 MED ORDER — SODIUM CHLORIDE 0.9 % IJ SOLN: 10.0000 mL | INTRAMUSCULAR | Status: DC | PRN

## 2015-01-17 MED ORDER — SODIUM CHLORIDE 0.9 % IV SOLN
25.0000 ug/h | INTRAVENOUS | Status: DC
  Administered 2015-01-17: 25 ug/h via INTRAVENOUS
  Administered 2015-01-18 – 2015-01-19 (×2): 150 ug/h via INTRAVENOUS
  Administered 2015-01-19: 200 ug/h via INTRAVENOUS
  Administered 2015-01-20: 100 ug/h via INTRAVENOUS
  Administered 2015-01-21: 50 ug/h via INTRAVENOUS
  Administered 2015-01-22: 100 ug/h via INTRAVENOUS
  Filled 2015-01-17 (×10): qty 50

## 2015-01-17 MED ORDER — SODIUM CHLORIDE 0.9 % IJ SOLN
10.0000 mL | Freq: Two times a day (BID) | INTRAMUSCULAR | Status: DC
  Administered 2015-01-18 – 2015-01-23 (×12): 10 mL
  Administered 2015-01-24: 20 mL
  Administered 2015-01-24 – 2015-01-25 (×2): 10 mL

## 2015-01-17 MED ORDER — EPINEPHRINE HCL 1 MG/ML IJ SOLN: 1.0000 mg | INTRAMUSCULAR | Status: DC

## 2015-01-17 MED ORDER — ETOMIDATE 2 MG/ML IV SOLN
INTRAVENOUS | Status: AC
  Filled 2015-01-17: qty 20

## 2015-01-17 MED ORDER — VANCOMYCIN HCL IN DEXTROSE 1-5 GM/200ML-% IV SOLN: 1000.0000 mg | Freq: Once | INTRAVENOUS | Status: DC

## 2015-01-17 MED ORDER — SODIUM CHLORIDE 0.9 % IV BOLUS (SEPSIS)
1000.0000 mL | INTRAVENOUS | Status: AC
  Administered 2015-01-17: 1000 mL via INTRAVENOUS

## 2015-01-17 MED ORDER — VASOPRESSIN 20 UNIT/ML IV SOLN
0.0300 [IU]/min | INTRAVENOUS | Status: DC
  Filled 2015-01-17: qty 2

## 2015-01-17 MED ORDER — PANTOPRAZOLE SODIUM 40 MG IV SOLR
40.0000 mg | INTRAVENOUS | Status: DC
  Administered 2015-01-17 – 2015-01-25 (×9): 40 mg via INTRAVENOUS
  Filled 2015-01-17 (×9): qty 40

## 2015-01-17 MED ORDER — NOREPINEPHRINE BITARTRATE 1 MG/ML IV SOLN
5.0000 ug/min | INTRAVENOUS | Status: DC
  Filled 2015-01-17: qty 4

## 2015-01-17 MED ORDER — ETOMIDATE 2 MG/ML IV SOLN
INTRAVENOUS | Status: DC | PRN
Start: 2015-01-17 — End: 2015-01-20
  Administered 2015-01-17 (×2): 20 mg via INTRAVENOUS

## 2015-01-17 MED ORDER — SUCCINYLCHOLINE CHLORIDE 20 MG/ML IJ SOLN
INTRAMUSCULAR | Status: AC
  Filled 2015-01-17: qty 1

## 2015-01-17 MED ORDER — DEXTROSE 5 % IV SOLN
1.0000 g | INTRAVENOUS | Status: DC
  Administered 2015-01-17 – 2015-01-20 (×4): 1 g via INTRAVENOUS
  Filled 2015-01-17 (×5): qty 1

## 2015-01-17 MED ORDER — ROCURONIUM BROMIDE 50 MG/5ML IV SOLN
INTRAVENOUS | Status: AC
  Filled 2015-01-17: qty 2

## 2015-01-17 MED ORDER — VANCOMYCIN HCL IN DEXTROSE 750-5 MG/150ML-% IV SOLN
750.0000 mg | INTRAVENOUS | Status: DC
  Administered 2015-01-18: 750 mg via INTRAVENOUS
  Filled 2015-01-17: qty 150

## 2015-01-17 MED ORDER — PHENYLEPHRINE 200 MCG/ML FOR PRIAPISM / HYPOTENSION
50.0000 ug | Freq: Once | INTRAMUSCULAR | Status: DC
  Filled 2015-01-17: qty 50

## 2015-01-17 MED FILL — Medication: Qty: 1 | Status: AC

## 2015-01-17 NOTE — Progress Notes (Signed)
Pt was transported to 2H10 from ED on vent. No complications noted.

## 2015-01-17 NOTE — ED Notes (Signed)
Stopped pacing

## 2015-01-17 NOTE — Progress Notes (Signed)
Dr Marchelle Gearing informed of all pt's information and verbalizes understanding. No new orders received at this time. Will continue to monitor

## 2015-01-17 NOTE — Progress Notes (Signed)
ETT was at 28 cm at the lip. Per MD and CXR pull back ETT 1 cm. ETT is now at 27 cm at the lip.

## 2015-01-17 NOTE — Progress Notes (Signed)
Attempted ABG on patient. Results were venous and critical reported values to MD. Asked if they would like a repeat gas. MD stated to hold off for now will get another later.

## 2015-01-17 NOTE — H&P (Signed)
PULMONARY / CRITICAL CARE MEDICINE   Name: Timothy Mayo MRN: [id number] DOB: [birth year]    ADMISSION DATE:  01/16/2015 CONSULTATION DATE:  2/19  REFERRING MD :  Gilmore Laroche  CHIEF COMPLAINT:  Acute respiratory failure septic shock   INITIAL PRESENTATION:  81 year old male admitted from home on 2/19 after being found w/ altered mental status and increased labored breathing. Admitted to ICU w/ working dx of acute respiratory failure in setting of CAP w/ resultant septic shock/MODS.   STUDIES:  CT head 2/19:   SIGNIFICANT EVENTS: 2/19: discussed prognosis w/ family. Made DNR in the event of cardiac arrest but otherwise aggressive care.   HISTORY OF PRESENT ILLNESS:   81 year old male w/ PMH: DM, HTN and some memory problems. Lives w/ daughters but high functioning. Was in usual state of health until the PM hours of 2/18 when daughter noted him to be more restless getting up and down all night saying "help me, help me". They decided to bring him to ER on 2/19 and ended up calling EMS as they could not get him dressed and he was less responsive. On EMS arrival he was hypothermic, bradycardic and hypotensive. IVFs were initiated. He was paced on arrival to ER. In ER found to be hypoxic and encephalopathic. Was intubated. Found to have Left sided airspace disease on CXR. Required epinephrine gtt to support BP. PCCM asked to admit.   PAST MEDICAL HISTORY :   has a past medical history of DIABETES MELLITUS, TYPE II (12/08/2006); DUODENAL ULCER (12/08/2006); and THORACOTOMY, HX OF (02/27/1994).  has no past surgical history on file. Prior to Admission medications   Medication Sig Start Date End Date Taking? Authorizing Provider  aspirin 81 MG EC tablet TAKE 1 TABLET (81 MG TOTAL) BY MOUTH DAILY. 12/11/14  Yes Tyrone Nine, MD  atorvastatin (LIPITOR) 40 MG tablet TAKE 1 TABLET BY MOUTH EVERY DAY AT [facility] 12/11/14  Yes Tyrone Nine, MD  carvedilol (COREG) 6.25 MG tablet TAKE 1 TABLET BY MOUTH TWICE A DAY  WITH A MEAL 12/11/14  Yes Tyrone Nine, MD  hydrALAZINE (APRESOLINE) 10 MG tablet TAKE 1 TABLET BY MOUTH EVERY 8 HOURS 12/11/14  Yes Tyrone Nine, MD  hydrochlorothiazide (HYDRODIURIL) 25 MG tablet Take 0.5 tablets (12.5 mg total) by mouth daily. 09/08/14  Yes [place] N Rumley, DO  isosorbide mononitrate (IMDUR) 15 mg TB24 24 hr tablet Take 0.5 tablets (15 mg total) by mouth daily. 10/22/14  Yes Tyrone Nine, MD  levothyroxine (SYNTHROID, LEVOTHROID) 50 MCG tablet Take 50 mcg by mouth daily. 12/24/14  Yes Historical Provider, MD  Vitamin D, Ergocalciferol, (DRISDOL) 50000 UNITS CAPS capsule Take 1 capsule by mouth once a week. Tuesday 12/24/14  Yes Historical Provider, MD  carbamide peroxide (DEBROX) 6.5 % otic solution Place 5 drops into both ears 2 (two) times daily. Patient not taking: Reported on 01/11/2015 09/06/14   Lora Havens Rumley, DO   No Known Allergies  FAMILY HISTORY:  has no family status information on file.  SOCIAL HISTORY:  reports that he has quit smoking. He has never used smokeless tobacco. He reports that he does not drink alcohol or use illicit drugs.  REVIEW OF SYSTEMS:  Unable   SUBJECTIVE:  On full vent support  VITAL SIGNS: Temp:  [90.9 F (32.7 C)-91.9 F (33.3 C)] 91.2 F (32.9 C) (02/19 1300) Pulse Rate:  [45-68] 50 (02/19 1300) Resp:  [11-19] 16 (02/19 1300) BP: (75-117)/(49-84) 103/69 mmHg (02/19 1300) SpO2:  [  100 %] 100 % (02/19 1300) FiO2 (%):  [100 %] 100 % (02/19 1213) Weight:  [58.968 kg (130 lb)] 58.968 kg (130 lb) (02/19 1210) HEMODYNAMICS:   VENTILATOR SETTINGS: Vent Mode:  [-] PRVC FiO2 (%):  [100 %] 100 % Set Rate:  [12 bmp] 12 bmp Vt Set:  [600 mL] 600 mL PEEP:  [5 cmH20] 5 cmH20 Plateau Pressure:  [24 cmH20] 24 cmH20 INTAKE / OUTPUT: No intake or output data in the 24 hours ending 06-Feb-2015 1330  PHYSICAL EXAMINATION: General:  Frail 81 year old male, sedated on vent. Now reaching for ETT Neuro:  Awake, reaching for tubes, no focal def   HEENT:  MM dry, no JVD NCAT Cardiovascular:  rrr Lungs:  Diffuse rhonchi  Abdomen:  NT + bowel sounds  Musculoskeletal:  Intact  Skin:  Dry no edema   LABS:  CBC  Recent Labs Lab Feb 06, 2015 1134 02/06/2015 1229  WBC 3.8*  --   HGB 8.3* 9.9*  HCT 25.8* 29.0*  PLT 133*  --    Coag's No results for input(s): APTT, INR in the last 168 hours. BMET  Recent Labs Lab 2015/02/06 1134 06-Feb-2015 1229  NA 137 137  K 4.3 4.7  CL 107 105  CO2 22  --   BUN 61* 71*  CREATININE 2.39* 2.30*  GLUCOSE 105* 100*   Electrolytes  Recent Labs Lab 2015-02-06 1134  CALCIUM 8.3*   Sepsis Markers  Recent Labs Lab 06-Feb-2015 1228  LATICACIDVEN 2.17*   ABG No results for input(s): PHART, PCO2ART, PO2ART in the last 168 hours. Liver Enzymes  Recent Labs Lab 02-06-15 1134  AST 148*  ALT 79*  ALKPHOS 141*  BILITOT 0.9  ALBUMIN 2.8*   Cardiac Enzymes  Recent Labs Lab 02-06-15 1134  TROPONINI 0.08*   Glucose No results for input(s): GLUCAP in the last 168 hours.  Imaging No results found.   ASSESSMENT / PLAN:  PULMONARY OETT 2/19 A: acute hypoxic respiratory Failure     CAP (left) P:   Full vent support F/u abg and cxr PAD protocol   CARDIOVASCULAR CVL right IJ 2/19>>> A:  Septic shock Bradycardia  Mild troponin bump-->possible demand ischemia  P:  EGDT protocol  Stress dose steroids  Volume for CVP goal 8-12 Pressors for MAP >65 Cycle CEs  RENAL A:   AKI (acute on chronic renal failure w/ BL sCr 1.5 to 1.6 range) Mild hematuria  H/o BPH Metabolic acidosis  P:   Trend renal fxn  Renal dose meds  Strict I&O Ck lactic acid   GASTROINTESTINAL A:   Weight loss Protein calorie malnutrition  Elevated LFTs-->likely shock liver  P:   Start tube feeds PPI for SUP Dietary consult  F/u LFTs  HEMATOLOGIC A:   Anemia of chronic disease w/out s/sx of bleeding  P:  [place]arson heparin  Trend cbc Transfuse per ICU protocol   INFECTIOUS A:   CAP Septic  shock  P:  BCx2 2/19>>> UC 2/19>>> Sputum 2/19>>> RVP 2/19>>> vanc 2/19>>> Zosyn 2/19>>> azithro 2/19>>>  ENDOCRINE A:   Hypothyroidism H/o DM  P:   Ck TSH ssi   NEUROLOGIC A:   Acute Encephalopathy  H/o memory loss P:   RASS goal: -2 PAD protocol  Supportive care   FAMILY  - Updates:   - Inter-disciplinary family meet or Palliative Care meeting due by:  2/26  TODAY'S SUMMARY:    81 year old male admitted from home on 2/19 after being found w/ altered mental  status and increased labored breathing. Admitted to ICU w/ working dx of acute respiratory failure in setting of CAP w/ resultant septic shock/MODS. Will initiate EGDT protocol. Family aware prognosis is poor. They desire aggressive care but agree that in event of cardiac arrest he will be DNR. Will admit to the ICU.    Simonne Martinet ACNP-BC [facility] Pulmonary/Critical Care Pager # [phone] OR # [phone] if no answer  CAP resulting in respiratory failure and septic shock.  Admitted to the ICU.  Spoke with family.  Will make DNR but continue support for now.  Will need to revisit code status pending how the patient declares himself.  Change epi to norepi.  Replace electrolytes.  Bear hugger.  The patient is critically ill with multiple organ systems failure and requires high complexity decision making for assessment and support, frequent evaluation and titration of therapies, application of advanced monitoring technologies and extensive interpretation of multiple databases.   Critical Care Time devoted to patient care services described in this note is  35  Minutes. This time reflects time of care of this signee Dr Koren Bound. This critical care time does not reflect procedure time, or teaching time or supervisory time of PA/NP/Med student/Med Resident etc but could involve care discussion time.  Alyson Reedy, M.D. [facility] Pulmonary/Critical Care Medicine. Pager: [phone]. After hours pager:  [phone].  02/05/2015, 1:30 PM

## 2015-01-17 NOTE — Progress Notes (Signed)
Chaplain responded to page from RN.  Family in consult room, some family at bedside now.  Chaplain providing ongoing emotional support as well as hospitality to family.  Will continue to follow up as needed.    2015-01-21 1300  Clinical Encounter Type  Visited With Patient;Family;Patient and family together;Health care provider  Visit Type Initial;Social support;Critical Care;ED  Referral From Nurse  Spiritual Encounters  Spiritual Needs Emotional;Grief support  Stress Factors  Patient Stress Factors Health changes  Family Stress Factors Exhausted;Family relationships;Health changes;Lack of knowledge   Timothy Mayo' January 21, 2015 1:08 PM

## 2015-01-17 NOTE — Progress Notes (Signed)
PCP Note   As patient's PCP I stopped by to visit Mr. Tuell this evening. Unfortunately his family had just left and he is intubated. I spoke with the nursing staff regarding his condition which is tenuous, still on levophed and hypothermic despite bear hugger. [facility] Medicine Teaching Service will be following along and available for any assistance during his stay.   Javon Snee B. Jarvis Newcomer, MD, PGY-2 01/21/15 5:47 PM  Personal Pager: [phone] Inpatient Intern Pager: [phone]

## 2015-01-17 NOTE — Progress Notes (Signed)
[facility] informed of pt's arrival and pt is a code sepsis. Spoke with Baxter Hire, RN via [employer].

## 2015-01-17 NOTE — ED Provider Notes (Signed)
CSN: [id number]     Arrival date & time 01/08/2015  1120 History   First MD Initiated Contact with Patient 01/22/2015 1213     Chief Complaint  Patient presents with  . Altered Mental Status  . Bradycardia     (Consider location/radiation/quality/duration/timing/severity/associated sxs/prior Treatment) HPI Comments: Patient presents with altered mental status and shortness of breath. Per EMS report, patient was unresponsive at home and was found to have a heart rate in the 20s. They started pacing the patient. [name] on an external pacemaker. They were unable to get an occasional blood pressure on the patient although they did have palpable femoral pulses. He had agonal respirations and EMS assisting ventilations. History was otherwise limited. There is no known history of dialysis or kidney disease. No family is at bedside.  Patient is a 81 y.o. male presenting with altered mental status.  Altered Mental Status   Past Medical History  Diagnosis Date  . DIABETES MELLITUS, TYPE II 12/08/2006  . DUODENAL ULCER 12/08/2006  . THORACOTOMY, HX OF 02/27/1994   History reviewed. No pertinent past surgical history. History reviewed. No pertinent family history. History  Substance Use Topics  . Smoking status: Former Games developer  . Smokeless tobacco: Never Used  . Alcohol Use: No    Review of Systems  Unable to perform ROS: Acuity of condition      Allergies  Review of patient's allergies indicates no known allergies.  Home Medications   Prior to Admission medications   Medication Sig Start Date End Date Taking? Authorizing Provider  aspirin 81 MG EC tablet TAKE 1 TABLET (81 MG TOTAL) BY MOUTH DAILY. 12/11/14  Yes Tyrone Nine, MD  atorvastatin (LIPITOR) 40 MG tablet TAKE 1 TABLET BY MOUTH EVERY DAY AT [facility] 12/11/14  Yes Tyrone Nine, MD  carvedilol (COREG) 6.25 MG tablet TAKE 1 TABLET BY MOUTH TWICE A DAY WITH A MEAL 12/11/14  Yes Tyrone Nine, MD  hydrALAZINE (APRESOLINE) 10 MG tablet TAKE  1 TABLET BY MOUTH EVERY 8 HOURS 12/11/14  Yes Tyrone Nine, MD  hydrochlorothiazide (HYDRODIURIL) 25 MG tablet Take 0.5 tablets (12.5 mg total) by mouth daily. 09/08/14  Yes [place] N Rumley, DO  isosorbide mononitrate (IMDUR) 15 mg TB24 24 hr tablet Take 0.5 tablets (15 mg total) by mouth daily. 10/22/14  Yes Tyrone Nine, MD  levothyroxine (SYNTHROID, LEVOTHROID) 50 MCG tablet Take 50 mcg by mouth daily. 12/24/14  Yes Historical Provider, MD  Vitamin D, Ergocalciferol, (DRISDOL) 50000 UNITS CAPS capsule Take 1 capsule by mouth once a week. Tuesday 12/24/14  Yes Historical Provider, MD  carbamide peroxide (DEBROX) 6.5 % otic solution Place 5 drops into both ears 2 (two) times daily. Patient not taking: Reported on 01/16/2015 09/06/14   [place] N Rumley, DO   BP 103/69 mmHg  Pulse 50  Temp(Src) 91.2 F (32.9 C) (Rectal)  Resp 16  Ht  (1.753 m)  Wt 130 lb (58.968 kg)  BMI 19.19 kg/m2  SpO2 100% Physical Exam  Constitutional: He appears well-developed and well-nourished. He appears distressed.  HENT:  Head: Normocephalic and atraumatic.  Kyphosis present  Eyes: Pupils are equal, round, and reactive to light.  Pupils are small bilaterally  Cardiovascular: Bradycardia present.   No murmur heard. Pulmonary/Chest: Effort normal.  Rhonchi bilaterally  Abdominal: Soft. He exhibits no distension. There is no tenderness.  Genitourinary: Penis normal.  Musculoskeletal: Normal range of motion. He exhibits no edema.  Neurological:  Unresponsive  Skin: Skin  is warm and dry.    ED Course  INTUBATION Date/Time: 01/05/2015 1:32 PM Performed by: Amond Speranza Authorized by: Rolan Bucco Consent: The procedure was performed in an emergent situation. Indications: respiratory distress and  airway protection Intubation method: video-assisted Patient status: paralyzed (RSI) Preoxygenation: nonrebreather mask Pretreatment medications: none Sedatives: etomidate Paralytic:  succinylcholine Laryngoscope size: Mac 4 Tube size: 8.0 mm Tube type: cuffed Number of attempts: 1 Cricoid pressure: no Cords visualized: yes Post-procedure assessment: chest rise and CO2 detector Breath sounds: equal and absent over the epigastrium Cuff inflated: yes ETT to lip: 27 cm Tube secured with: ETT holder Chest x-ray interpreted by me and radiologist. Chest x-ray findings: endotracheal tube too low Tube repositioned: tube repositioned successfully Patient tolerance: Patient tolerated the procedure well with no immediate complications   (including critical care time) Labs Review Labs Reviewed  COMPREHENSIVE METABOLIC PANEL - Abnormal; Notable for the following:    Glucose, Bld 105 (*)    BUN 61 (*)    Creatinine, Ser 2.39 (*)    Calcium 8.3 (*)    Total Protein 5.2 (*)    Albumin 2.8 (*)    AST 148 (*)    ALT 79 (*)    Alkaline Phosphatase 141 (*)    GFR calc non Af Amer 23 (*)    GFR calc Af Amer 26 (*)    All other components within normal limits  TROPONIN I - Abnormal; Notable for the following:    Troponin I 0.08 (*)    All other components within normal limits  CBC WITH DIFFERENTIAL/PLATELET - Abnormal; Notable for the following:    WBC 3.8 (*)    RBC 3.35 (*)    Hemoglobin 8.3 (*)    HCT 25.8 (*)    MCV 77.0 (*)    MCH 24.8 (*)    RDW 22.2 (*)    Platelets 133 (*)    Neutrophils Relative % 87 (*)    Lymphocytes Relative 9 (*)    Lymphs Abs 0.3 (*)    All other components within normal limits  I-STAT CHEM 8, ED - Abnormal; Notable for the following:    BUN 71 (*)    Creatinine, Ser 2.30 (*)    Glucose, Bld 100 (*)    Calcium, Ion 1.12 (*)    Hemoglobin 9.9 (*)    HCT 29.0 (*)    All other components within normal limits  I-STAT CG4 LACTIC ACID, ED - Abnormal; Notable for the following:    Lactic Acid, Venous 2.17 (*)    All other components within normal limits  I-STAT VENOUS BLOOD GAS, ED - Abnormal; Notable for the following:    pH, Ven  7.175 (*)    pCO2, Ven 21.6 (*)    pO2, Ven 27.0 (*)    Bicarbonate 8.5 (*)    Acid-base deficit 19.0 (*)    All other components within normal limits  CULTURE, BLOOD (ROUTINE X 2)  CULTURE, BLOOD (ROUTINE X 2)  URINE CULTURE  CULTURE, BLOOD (ROUTINE X 2)  CULTURE, BLOOD (ROUTINE X 2)  URINE CULTURE  CULTURE, EXPECTORATED SPUTUM-ASSESSMENT  URINALYSIS, ROUTINE W REFLEX MICROSCOPIC  BLOOD GAS, ARTERIAL  CBC WITH DIFFERENTIAL/PLATELET  COMPREHENSIVE METABOLIC PANEL  APTT  PROTIME-INR  BLOOD GAS, ARTERIAL  LACTIC ACID, PLASMA  CORTISOL  URINALYSIS, ROUTINE W REFLEX MICROSCOPIC  MAGNESIUM  PHOSPHORUS  TSH  TROPONIN I  TROPONIN I  TROPONIN I  I-STAT CG4 LACTIC ACID, ED  TYPE AND SCREEN  Imaging Review Dg Chest Portable 1 View  01/12/2015   CLINICAL DATA:  Altered mental status.  Bradycardia  EXAM: PORTABLE CHEST - 1 VIEW  COMPARISON:  08/19/2015  FINDINGS: The endotracheal tube tip is just above the carina and is directed towards the right mainstem bronchus. This may need to be withdrawn by 1-2 cm. There is a nasogastric tube with tip below the GE junction. Normal heart size. There are bilateral pleural effusions noted left greater night.  IMPRESSION: 1. ET tube tip is at the carina and is directed towards the right mainstem bronchus. Recommend withdrawal and by 1-2 cm. 2. Bilateral pleural effusions left greater night.   Electronically Signed   By: Signa Kell M.D.   On: 12/30/2014 12:26     EKG Interpretation None      MDM   Final diagnoses:  Altered mental status    Patient was brought in on a pacemaker. The pacemaker was removed and patient's underlying rhythm appeared to be an irregular atrial fibrillation type rhythm. He was bradycardic with heart rate in the 40s. He was hypotensive with a manual blood pressure in the 70s. He was given a milligram of atropine. He was found to be hypothermic with a rectal temperature of 91. He was placed on a bear hugger. He was  started on IV fluids as well as an epinephrine drip and sepsis protocol. Broad-spectrum antibiotics were given. He was intubated and received phenylephrine push doses x 2 peri intubation.  His blood pressure improved to the 90s and then the 100 systolic. His temperature remained low at 90.9. Critical care was consulted to admit the patient. Of note the ET tube was found to be a little bit too deep and was pulled back 1 cm by Respiratory therapy.  CRITICAL CARE Performed by: Jameisha Stofko Total critical care time: 60 Critical care time was exclusive of separately billable procedures and treating other patients. Critical care was necessary to treat or prevent imminent or life-threatening deterioration. Critical care was time spent personally by me on the following activities: development of treatment plan with patient and/or surrogate as well as nursing, discussions with consultants, evaluation of patient's response to treatment, examination of patient, obtaining history from patient or surrogate, ordering and performing treatments and interventions, ordering and review of laboratory studies, ordering and review of radiographic studies, pulse oximetry and re-evaluation of patient's condition.   Rolan Bucco, MD 01/09/2015 [phone]

## 2015-01-17 NOTE — ED Notes (Signed)
Patient not verbally responsive, moving all extremities to pain with IV stick. Patient on NRB. Fingers are cold unable to get SPO2 sats. Radial pulses thready. Femoral pulse located by [employer] RN 2+

## 2015-01-17 NOTE — ED Notes (Signed)
Fredderick Phenix, MD notified of abnormal lab test results

## 2015-01-17 NOTE — ED Notes (Signed)
Patient here for bradycardia,paced on arrival by ems, pacing at 60 bpm, and 75 mamps, pt unresponsive on arrival, radial pulses present. Pt cool to touch per EMS pt full of fluid through out.

## 2015-01-17 NOTE — Progress Notes (Signed)
Spoke with elink RN, as doctor unavailable. Informed of pt's CVP, lab values, plan of care, and drip rates. Elink RN to speak with Dr Marchelle Gearing and call back regarding further orders.

## 2015-01-17 NOTE — Progress Notes (Signed)
Per ED nurse, blood cultures drawn in ed and pt given antibiotics. Report received from [place], [place]

## 2015-01-17 NOTE — Care Management Note (Signed)
    Page 1 of 1   01/08/2015     3:20:14 PM CARE MANAGEMENT NOTE 01/24/2015  Patient:  NAKODA, COLLARD   Account Number:  [id number]  Date Initiated:  01/13/2015  Documentation initiated by:  Junius Creamer  Subjective/Objective Assessment:   adm w resp failure-vent     Action/Plan:   lives w fam, pcp dr Alycia Rossetti Jarvis Newcomer   Anticipated DC Date:     Anticipated DC Plan:  HOME W HOME HEALTH SERVICES         Choice offered to / List presented to:             Status of service:   Medicare Important Message given?   (If response is "NO", the following Medicare IM given date fields will be blank) Date Medicare IM given:   Medicare IM given by:   Date Additional Medicare IM given:   Additional Medicare IM given by:    Discharge Disposition:    Per UR Regulation:  Reviewed for med. necessity/level of care/duration of stay  If discussed at Long Length of Stay Meetings, dates discussed:    Comments:

## 2015-01-17 NOTE — ED Notes (Signed)
Tanja Port NP talking to family pt returned from CT with this RN and April RT. Patient moving upper extremities- Dr. Fredderick Phenix made aware and order for IV fentanyl placed

## 2015-01-17 NOTE — ED Notes (Signed)
Tanja Port NP and [facility] NP starting central line

## 2015-01-17 NOTE — ED Notes (Signed)
Dr. Fredderick Phenix intubated patient with RT at bedside and [facility] RN pushind sedation meds. MD used glidescope for visualization with positive breath sounds. DG portable called for chest. Lequita Halt RN RN inserted 12G OG

## 2015-01-17 NOTE — Progress Notes (Signed)
ANTIBIOTIC CONSULT NOTE - INITIAL  Pharmacy Consult for Vancomycin and Cefepime Indication: PNA  No Known Allergies  Patient Measurements: Height: 5\' 9"  (175.3 cm) Weight: 130 lb (58.968 kg) IBW/kg (Calculated) : 70.7  Vital Signs: Temp: 91.2 F (32.9 C) (02/19 1300) Temp Source: Rectal (02/19 1233) BP: 103/69 mmHg (02/19 1300) Pulse Rate: 50 (02/19 1300) Intake/Output from previous day:   Intake/Output from this shift:    Labs:  Recent Labs  30-Jan-2015 1134 01/30/15 1229  WBC 3.8*  --   HGB 8.3* 9.9*  PLT 133*  --   CREATININE 2.39* 2.30*   Estimated Creatinine Clearance: 18.9 mL/min (by C-G formula based on Cr of 2.3). No results for input(s): VANCOTROUGH, VANCOPEAK, VANCORANDOM, GENTTROUGH, GENTPEAK, GENTRANDOM, TOBRATROUGH, TOBRAPEAK, TOBRARND, AMIKACINPEAK, AMIKACINTROU, AMIKACIN in the last 72 hours.   Microbiology: No results found for this or any previous visit (from the past 720 hour(s)).  Medical History: Past Medical History  Diagnosis Date  . DIABETES MELLITUS, TYPE II 12/08/2006  . DUODENAL ULCER 12/08/2006  . THORACOTOMY, HX OF 02/27/1994    Medications:   (Not in a hospital admission)   Assessment: 81 yo M presents on 2/19 acute respiratory failure and septic shock. PMH includes DM, HTN, and some memory problems. Pt received 1 dose of Zosyn and vancomycin 1g in the ED for sepsis. Now pharmacy to dose cefepime and vancomycin for PNA. Pt is hypothermic, WBC low at 3.8. SCr is elevated at 2.3 (BL SCr was 1.5 in 07/2014), CrCl ~7ml/min.  Goal of Therapy:  Vancomycin trough level 15-20 mcg/ml  Resolution of infection  Plan:  Start cefepime 1g Q24 at [facility] vancomycin 750mg  IV Q48 on 2/20 at 1300 Monitor renal function, clinical picture F/U renal function trend to change doses  Tahliyah Anagnos J 01-30-15,1:47 PM

## 2015-01-17 NOTE — Progress Notes (Signed)
Transported pt to CT and back to ED on vent. No complications noted.

## 2015-01-17 NOTE — Procedures (Signed)
Central Venous Catheter Insertion Procedure Note Derl Gregorius [id number] September 26, 1925  Procedure: Insertion of Central Venous Catheter Indications: Assessment of intravascular volume, Drug and/or fluid administration and Frequent blood sampling  Procedure Details Consent: Risks of procedure as well as the alternatives and risks of each were explained to the (patient/caregiver).  Consent for procedure obtained. Time Out: Verified patient identification, verified procedure, site/side was marked, verified correct patient position, special equipment/implants available, medications/allergies/relevent history reviewed, required imaging and test results available.  Performed Real time [place] was used to ID and cannulate the vessel  Maximum sterile technique was used including antiseptics, cap, gloves, gown, hand hygiene, mask and sheet. Skin prep: Chlorhexidine; local anesthetic administered A antimicrobial bonded/coated triple lumen catheter was placed in the right internal jugular vein using the Seldinger technique.  Evaluation Blood flow good Complications: No apparent complications Patient did tolerate procedure well. Chest X-ray ordered to verify placement.  CXR: pending.  BABCOCK,PETE 01/28/2015, 2:16 PM  U/S used in placement.  I was present and supervised the entire procedure.  Alyson Reedy, M.D. [facility] Pulmonary/Critical Care Medicine. Pager: [phone]. After hours pager: [phone].

## 2015-01-17 NOTE — Progress Notes (Signed)
Pulled ETT back from 27 cm to 25 cm per MD order.

## 2015-01-17 NOTE — Progress Notes (Signed)
Called to verify if cultures were completed. One set drawn and lab at bedside for second set. Antibiotics hung as soon as lab finished with cultures.

## 2015-01-18 ENCOUNTER — Inpatient Hospital Stay (HOSPITAL_COMMUNITY): Payer: Medicare Other

## 2015-01-18 DIAGNOSIS — R609 Edema, unspecified: Secondary | ICD-10-CM

## 2015-01-18 DIAGNOSIS — R571 Hypovolemic shock: Secondary | ICD-10-CM

## 2015-01-18 DIAGNOSIS — A419 Sepsis, unspecified organism: Secondary | ICD-10-CM

## 2015-01-18 DIAGNOSIS — J189 Pneumonia, unspecified organism: Secondary | ICD-10-CM | POA: Diagnosis present

## 2015-01-18 DIAGNOSIS — I420 Dilated cardiomyopathy: Secondary | ICD-10-CM

## 2015-01-18 LAB — CBC
HCT: 23.2 % — ABNORMAL LOW (ref 39.0–52.0)
HEMOGLOBIN: 7.5 g/dL — AB (ref 13.0–17.0)
MCH: 24.1 pg — ABNORMAL LOW (ref 26.0–34.0)
MCHC: 32.3 g/dL (ref 30.0–36.0)
MCV: 74.6 fL — ABNORMAL LOW (ref 78.0–100.0)
PLATELETS: 130 10*3/uL — AB (ref 150–400)
RBC: 3.11 MIL/uL — ABNORMAL LOW (ref 4.22–5.81)
RDW: 22.4 % — ABNORMAL HIGH (ref 11.5–15.5)
WBC: 7 10*3/uL (ref 4.0–10.5)

## 2015-01-18 LAB — GLUCOSE, CAPILLARY
Glucose-Capillary: 86 mg/dL (ref 70–99)
Glucose-Capillary: 82 mg/dL (ref 70–99)
Glucose-Capillary: 77 mg/dL (ref 70–99)
Glucose-Capillary: 125 mg/dL — ABNORMAL HIGH (ref 70–99)

## 2015-01-18 LAB — BASIC METABOLIC PANEL
Anion gap: 10 (ref 5–15)
BUN: 60 mg/dL — ABNORMAL HIGH (ref 6–23)
CALCIUM: 8.4 mg/dL (ref 8.4–10.5)
CO2: 20 mmol/L (ref 19–32)
CREATININE: 2.43 mg/dL — AB (ref 0.50–1.35)
Chloride: 107 mmol/L (ref 96–112)
GFR calc non Af Amer: 22 mL/min — ABNORMAL LOW (ref >90)
GFR, EST AFRICAN AMERICAN: 26 mL/min — AB (ref >90)
GLUCOSE: 83 mg/dL (ref 70–99)
POTASSIUM: 4.6 mmol/L (ref 3.5–5.1)
SODIUM: 137 mmol/L (ref 135–145)

## 2015-01-18 LAB — MAGNESIUM: Magnesium: 1.7 mg/dL (ref 1.5–2.5)

## 2015-01-18 LAB — PHOSPHORUS: Phosphorus: 3.1 mg/dL (ref 2.3–4.6)

## 2015-01-18 LAB — CORTISOL: Cortisol, Plasma: 18.5 ug/dL

## 2015-01-18 LAB — PROCALCITONIN: Procalcitonin: 1.34 ng/mL

## 2015-01-18 LAB — TROPONIN I: TROPONIN I: 0.1 ng/mL — AB (ref <0.031)

## 2015-01-18 MED ORDER — CLONAZEPAM 1 MG PO TABS
2.0000 mg | ORAL_TABLET | Freq: Two times a day (BID) | ORAL | Status: DC
  Administered 2015-01-18: 2 mg via ORAL
  Filled 2015-01-18: qty 2

## 2015-01-18 MED ORDER — LEVOTHYROXINE SODIUM 100 MCG IV SOLR
100.0000 ug | Freq: Every day | INTRAVENOUS | Status: DC
  Administered 2015-01-19 – 2015-01-25 (×7): 100 ug via INTRAVENOUS
  Filled 2015-01-18 (×7): qty 5

## 2015-01-18 MED ORDER — HYDROCORTISONE NA SUCCINATE PF 100 MG IJ SOLR: 50.0000 mg | Freq: Four times a day (QID) | INTRAMUSCULAR | Status: DC

## 2015-01-18 MED ORDER — LEVOTHYROXINE SODIUM 100 MCG IV SOLR
300.0000 ug | Freq: Once | INTRAVENOUS | Status: AC
  Administered 2015-01-18: 300 ug via INTRAVENOUS
  Filled 2015-01-18: qty 15

## 2015-01-18 MED ORDER — DEXTROSE 5 % IV SOLN
2.0000 g | Freq: Once | INTRAVENOUS | Status: AC
  Administered 2015-01-18: 2 g via INTRAVENOUS
  Filled 2015-01-18: qty 4

## 2015-01-18 MED ORDER — VITAL HIGH PROTEIN PO LIQD
1000.0000 mL | ORAL | Status: DC
  Administered 2015-01-18: 1000 mL
  Filled 2015-01-18 (×2): qty 1000

## 2015-01-18 MED ORDER — HEPARIN SODIUM (PORCINE) 5000 UNIT/ML IJ SOLN
5000.0000 [IU] | Freq: Three times a day (TID) | INTRAMUSCULAR | Status: DC
  Administered 2015-01-18 – 2015-01-20 (×6): 5000 [IU] via SUBCUTANEOUS
  Filled 2015-01-18 (×8): qty 1

## 2015-01-18 MED ORDER — VITAL HIGH PROTEIN PO LIQD
1000.0000 mL | ORAL | Status: DC
  Administered 2015-01-18 – 2015-01-19 (×5)
  Administered 2015-01-19: 1000 mL
  Administered 2015-01-19
  Administered 2015-01-20 – 2015-01-21 (×2): 1000 mL
  Filled 2015-01-18 (×8): qty 1000

## 2015-01-18 MED ORDER — INSULIN ASPART 100 UNIT/ML ~~LOC~~ SOLN
0.0000 [IU] | SUBCUTANEOUS | Status: DC
  Administered 2015-01-18 – 2015-01-20 (×6): 1 [IU] via SUBCUTANEOUS
  Administered 2015-01-20 – 2015-01-21 (×2): 2 [IU] via SUBCUTANEOUS
  Administered 2015-01-21 (×2): 1 [IU] via SUBCUTANEOUS
  Administered 2015-01-21: 2 [IU] via SUBCUTANEOUS
  Administered 2015-01-21 – 2015-01-22 (×2): 1 [IU] via SUBCUTANEOUS
  Administered 2015-01-22: 2 [IU] via SUBCUTANEOUS
  Administered 2015-01-22 (×2): 1 [IU] via SUBCUTANEOUS
  Administered 2015-01-22: 2 [IU] via SUBCUTANEOUS
  Administered 2015-01-23 (×4): 1 [IU] via SUBCUTANEOUS
  Administered 2015-01-23 – 2015-01-24 (×4): 2 [IU] via SUBCUTANEOUS
  Administered 2015-01-24: 1 [IU] via SUBCUTANEOUS

## 2015-01-18 MED ORDER — CLONAZEPAM 0.1 MG/ML ORAL SUSPENSION: 2.0000 mg | Freq: Two times a day (BID) | ORAL | Status: DC

## 2015-01-18 MED ORDER — NOREPINEPHRINE BITARTRATE 1 MG/ML IV SOLN
2.0000 ug/min | INTRAVENOUS | Status: DC
  Administered 2015-01-18: 15 ug/min via INTRAVENOUS
  Administered 2015-01-18: 5 ug/min via INTRAVENOUS
  Administered 2015-01-18: 23 ug/min via INTRAVENOUS
  Administered 2015-01-19: 2 ug/min via INTRAVENOUS
  Administered 2015-01-19: 10 ug/min via INTRAVENOUS
  Filled 2015-01-18 (×6): qty 4

## 2015-01-18 MED ORDER — FENTANYL BOLUS VIA INFUSION
100.0000 ug | INTRAVENOUS | Status: DC | PRN
  Administered 2015-01-18: 50 ug via INTRAVENOUS
  Filled 2015-01-18: qty 100

## 2015-01-18 NOTE — H&P (Signed)
PULMONARY / CRITICAL CARE MEDICINE   Name: Timothy Mayo MRN: [id number] DOB: May 10, 1925    ADMISSION DATE:  01/07/2015 CONSULTATION DATE:  2/19  REFERRING MD :  Lenor Derrick  CHIEF COMPLAINT:  Acute respiratory failure septic shock   INITIAL PRESENTATION:  81 year old male admitted from home on 2/19 after being found w/ altered mental status and increased labored breathing. Admitted to ICU w/ working dx of acute respiratory failure in setting of CAP w/ resultant septic shock/MODS.   STUDIES:  CT head 2/19: NAD  SIGNIFICANT EVENTS: 2/19: discussed prognosis w/ family. Made DNR in the event of cardiac arrest but otherwise aggressive care.   SUBJECTIVE:  Remains on epi, low urine output  VITAL SIGNS: Temp:  [90.9 F (32.7 C)-99.7 F (37.6 C)] 99 F (37.2 C) (02/20 0700) Pulse Rate:  [34-81] 53 (02/20 0700) Resp:  [11-34] 16 (02/20 0700) BP: (75-126)/(43-84) 107/60 mmHg (02/20 0700) SpO2:  [100 %] 100 % (02/20 0700) FiO2 (%):  [40 %-100 %] 40 % (02/20 0700) Weight:  [58.968 kg (130 lb)-76 kg (167 lb 8.8 oz)] 76 kg (167 lb 8.8 oz) (02/20 0500) HEMODYNAMICS: CVP:  [5 mmHg-14 mmHg] 14 mmHg VENTILATOR SETTINGS: Vent Mode:  [-] PRVC FiO2 (%):  [40 %-100 %] 40 % Set Rate:  [12 bmp-16 bmp] 16 bmp Vt Set:  [600 mL] 600 mL PEEP:  [5 cmH20] 5 cmH20 Plateau Pressure:  [24 cmH20-30 cmH20] 27 cmH20 INTAKE / OUTPUT:  Intake/Output Summary (Last 24 hours) at 01/18/15 0831 Last data filed at 01/18/15 0700  Gross per 24 hour  Intake 2994.34 ml  Output    355 ml  Net 2639.34 ml    PHYSICAL EXAMINATION: General:  sedated Neuro:  rass 1 HEENT:  MM dry, no JVD NCAT Cardiovascular:  rrr s1 s 2 Lungs:  ronchi throughout Abdomen:  NT + bowel sounds  Musculoskeletal:  Intact  Skin:  Dry no edema   LABS:  CBC  Recent Labs Lab 01/23/2015 1134 12/31/2014 1229 12/31/2014 1315 01/18/15 0330  WBC 3.8*  --  3.5* 7.0  HGB 8.3* 9.9* 7.5* 7.5*  HCT 25.8* 29.0* 23.0* 23.2*  PLT 133*  --   100* 130*   Coag's  Recent Labs Lab 01/16/2015 1315  APTT 30  INR 1.24   BMET  Recent Labs Lab 01/08/2015 1134 12/31/2014 1229 12/30/2014 1315 01/18/15 0330  NA 137 137 133* 137  K 4.3 4.7 4.4 4.6  CL 107 105 102 107  CO2 22  --  26 20  BUN 61* 71* 56* 60*  CREATININE 2.39* 2.30* 2.32* 2.43*  GLUCOSE 105* 100* 133* 83   Electrolytes  Recent Labs Lab 01/14/2015 1134 01/09/2015 1315 01/18/15 0330  CALCIUM 8.3* 8.3* 8.4  MG  --  1.9 1.7  PHOS  --  3.2 3.1   Sepsis Markers  Recent Labs Lab 01/07/2015 1228 01/10/2015 1316  LATICACIDVEN 2.17* 1.8   ABG  Recent Labs Lab 01/24/2015 1439  PHART 7.488*  PCO2ART 28.6*  PO2ART 528.0*   Liver Enzymes  Recent Labs Lab 01/08/2015 1134 01/11/2015 1315  AST 148* 170*  ALT 79* 90*  ALKPHOS 141* 148*  BILITOT 0.9 1.2  ALBUMIN 2.8* 2.7*   Cardiac Enzymes  Recent Labs Lab 01/23/2015 1134 01/08/2015 1315 12/31/2014 1918  TROPONINI 0.08* 0.08* 0.08*   Glucose  Recent Labs Lab 01/22/2015 2038 01/18/15 0742  GLUCAP 126* 86    Imaging Ct Head Wo Contrast  01/20/2015   CLINICAL DATA:  Altered mental status,  unresponsive, found down by family this morning, question sepsis, history diabetes  EXAM: CT HEAD WITHOUT CONTRAST  TECHNIQUE: Contiguous axial images were obtained from the base of the skull through the vertex without intravenous contrast.  COMPARISON:  08/25/2014  FINDINGS: Motion artifacts, for which repeat imaging was performed.  Generalized atrophy.  Ex vacuo dilatation of the ventricular system.  No midline shift or mass effect.  Small vessel chronic ischemic changes of deep cerebral white matter.  No intracranial hemorrhage, mass lesion or evidence acute infarction.  No extra-axial fluid collections.  Chronic mucosal thickening with thickening and sclerosis of the bony walls of the sphenoid sinus compatible with chronic sinusitis.  Tiny foci of gas are identified laterally at the RIGHT supraorbital region question laceration.   Bones and sinuses otherwise unremarkable.  Atherosclerotic calcifications at skullbase.  IMPRESSION: Atrophy with small vessel chronic ischemic changes of deep cerebral white matter.  No definite acute intracranial abnormalities.  Chronic sphenoid sinusitis.   Electronically Signed   By: Lavonia Dana M.D.   On: 01/01/2015 13:42   Dg Chest Port 1 View  01/08/2015   CLINICAL DATA:  Intubation, diabetes type 2, congestive dilated cardiomyopathy, essential hypertension  EXAM: PORTABLE CHEST - 1 VIEW  COMPARISON:  Portable exam 1428 hours compared to 01/05/2015 at 1205 hours  FINDINGS: Tip of endotracheal tube projects 4.7 cm above carina.  Nasogastric tube extends into abdomen.  RIGHT jugular central venous catheter tip projects over SVC.  Numerous EKG leads and external pacing leads noted.  Normal heart size, mediastinal contours and pulmonary vascularity.  Moderate LEFT pleural effusion and basilar atelectasis.  Skin fold projects over RIGHT upper lobe.  No definite infiltrate, pleural effusion or pneumothorax.  IMPRESSION: Line and tube positions as above.  Moderate LEFT pleural effusion and mild basilar atelectasis.   Electronically Signed   By: Lavonia Dana M.D.   On: 01/13/2015 14:45   Dg Chest Portable 1 View  01/20/2015   CLINICAL DATA:  Altered mental status.  Bradycardia  EXAM: PORTABLE CHEST - 1 VIEW  COMPARISON:  08/19/2015  FINDINGS: The endotracheal tube tip is just above the carina and is directed towards the right mainstem bronchus. This may need to be withdrawn by 1-2 cm. There is a nasogastric tube with tip below the GE junction. Normal heart size. There are bilateral pleural effusions noted left greater night.  IMPRESSION: 1. ET tube tip is at the carina and is directed towards the right mainstem bronchus. Recommend withdrawal and by 1-2 cm. 2. Bilateral pleural effusions left greater night.   Electronically Signed   By: Kerby Moors M.D.   On: 01/15/2015 12:26    ASSESSMENT /  PLAN:  PULMONARY OETT 2/19 A: acute hypoxic respiratory Failure     CAP (left) P:   abg reviewed, consider rate reduction No SBT in high dose pressors for now pcxr follow up for left diffuse haziness, layering effusion? Infiltrate? To minimal O2  CARDIOVASCULAR CVL right IJ 2/19>>> A:  Septic shock Bradycardia - myxedema coma Presumed adrenal insuff Mild troponin bump-->possible demand ischemia  P:  EGDT protocol completed Stress dose steroids STAT ensure prior to any IV synthroid Volume for CVP goals met, kvo Pressors for MAP >60 Epi drip noted, low output, transition to levophed Echo assessment Change to IV synthroid, avoid T3 or high dose T4 until we see EF, valvular fxn  RENAL A:   AKI (acute on chronic renal failure w/ BL sCr 1.5 to 1.6 range) - ATN sepsis  Mild hematuria  H/o BPH Metabolic acidosis  Mild hypomagnesemia P:   Chem in am  kvo cvp 16 Renal US Mag supp  GASTROINTESTINAL A:   Weight loss Protein calorie malnutrition  Elevated LFTs-->likely shock liver  P:   Start tube feeds ppi  HEMATOLOGIC A:   Anemia of chronic disease w/out s/sx of bleeding  P:  [place] heparin  Trend cbc Transfuse per ICU protocol   INFECTIOUS A:   CAP Septic shock  P:  BCx2 2/19>>> UC 2/19>>> Sputum 2/19>>> RVP 2/19>>> vanc 2/19>>> Zosyn 2/19>>> azithro 2/19>>>  Maintain current abx, follow pressor needs Pct assessment  ENDOCRINE A:   Hypothyroidism with mxyedma coma H/o DM  P:   Ck TSH noted, stat steroids, IV synthroid bolus then 100 IV daily ssi  Get echo  NEUROLOGIC A:   Acute Encephalopathy  H/o memory loss P:   RASS goal: -2 PAD protocol  Supportive care   FAMILY  - Updates: none noted - Inter-disciplinary family meet or Palliative Care meeting due by:  2/26   Ccm time 30 min   Lavon Paganini. Titus Mould, MD, [place] Pgr: [facility]

## 2015-01-18 NOTE — Progress Notes (Signed)
eLink Physician-Brief Progress Note Patient Name: Timothy Mayo DOB: [birth year] MRN: [id number]   Date of Service  01/18/2015  HPI/Events of Note  Agitation on fentanyl drip  eICU Interventions  Added clonazepam 2 mg per ft bid     Intervention Category Minor Interventions: Agitation / anxiety - evaluation and management  Sandrea Hughs 01/18/2015, 9:32 PM

## 2015-01-18 NOTE — Progress Notes (Signed)
  Echocardiogram 2D Echocardiogram has been performed.  Janalyn Harder 01/18/2015, 12:06 PM

## 2015-01-18 NOTE — Progress Notes (Signed)
VASCULAR LAB PRELIMINARY  PRELIMINARY  PRELIMINARY  PRELIMINARY  Left upper extremity venous Doppler completed.    Preliminary report:  There is no DVT noted in the left upper extremity.  Unable to evaluate superficial veins secondary to extreme edema and interstitial fluid.   Jathniel Smeltzer, RVT 01/18/2015, 2:59 PM

## 2015-01-18 NOTE — Progress Notes (Signed)
INITIAL NUTRITION ASSESSMENT  DOCUMENTATION CODES Per approved criteria  -Not Applicable   INTERVENTION:  Initiate Vital High Protein @ 20 ml/hr via OGT and increase by 10 ml every 4  hours to goal rate of 75 ml/hr.   Tube feeding regimen provides 1800 kcal (100% of needs), 158 grams of protein, and 1504 ml of H2O.   NUTRITION DIAGNOSIS: Inadequate oral intake related to inability to eat as evidenced by NPO status  Goal: Pt to meet >/= 90% of their estimated nutrition needs   Monitor:  Initiation of TF, tf to goal, tf tolerance, labs, weight, goals of care   Reason for Assessment: New Vent  81 y.o. male  Admitting Dx: <principal problem not specified>  ASSESSMENT: 81 year old male admitted from home on 2/19 after being found w/ altered mental status and increased labored breathing. Admitted to ICU w/ working dx of acute respiratory failure in setting of CAP w/ resultant septic shock/MODS.   Briefly spoke to son at bedside. He is unaware of how pt was eating or what his normal weight was, but he does think that the pt looks skinnier  Patient is currently intubated on ventilator support MV: 11.5 L/min Temp (24hrs), Avg:95.9 F (35.5 C), Min:90.9 F (32.7 C), Max:99.7 F (37.6 C)  Nutrition Focused Physical Exam: Pt has edema, hard to determine muscle/fat loss  Subcutaneous Fat:  Orbital Region: none Upper Arm Region: fluid overloaded Thoracic and Lumbar Region: n/a  Muscle:  Temple Region: moderate Clavicle Bone Region:none Clavicle and Acromion Bone Region:none Scapular Bone Region: n/a Dorsal Hand: n/a Patellar Region: none Anterior Thigh Region: n/a Posterior Calf Region: none  Edema: +1 all extremities    Height: Ht Readings from Last 1 Encounters:  01/11/2015  (1.753 m)    Weight: Wt Readings from Last 1 Encounters:  01/18/15 167 lb 8.8 oz (76 kg)    Ideal Body Weight: 160 lbs  % Ideal Body Weight: 104%  Wt Readings from Last 10  Encounters:  01/18/15 167 lb 8.8 oz (76 kg)  10/03/14 131 lb (59.421 kg)  09/06/14 131 lb (59.421 kg)  08/26/14 121 lb (54.885 kg)  07/16/14 123 lb (55.792 kg)  02/09/12 135 lb (61.236 kg)  11/12/11 132 lb (59.875 kg)  11/02/11 140 lb 4.8 oz (63.64 kg)  10/10/07 152 lb 6.4 oz (69.128 kg)  04/18/07 155 lb (70.308 kg)  on 2/19 pt weighed 130-Would indicate he has maintained weight before all the fluid was given to him.   Usual Body Weight: unknown  BMI:  Body mass index is 24.73 kg/(m^2).  Estimated Nutritional Needs: Kcal:1798 kcals Protein: 136-152 (1.8-2 g/kg) Fluid: per md  Skin: Dry/Flaky  Diet Order: Diet NPO time specified  EDUCATION NEEDS: -No education needs identified at this time   Intake/Output Summary (Last 24 hours) at 01/18/15 0804 Last data filed at 01/18/15 0700  Gross per 24 hour  Intake 2994.34 ml  Output    355 ml  Net 2639.34 ml    Last BM:2/18  Labs:   Recent Labs Lab 01/09/2015 1134 01/11/2015 1229 01/26/2015 1315 01/18/15 0330  NA 137 137 133* 137  K 4.3 4.7 4.4 4.6  CL 107 105 102 107  CO2 22  --  26 20  BUN 61* 71* 56* 60*  CREATININE 2.39* 2.30* 2.32* 2.43*  CALCIUM 8.3*  --  8.3* 8.4  MG  --   --  1.9 1.7  PHOS  --   --  3.2 3.1  GLUCOSE  105* 100* 133* 83    CBG (last 3)   Recent Labs  01/09/2015 2038 01/18/15 0742  GLUCAP 126* 86    Scheduled Meds: . antiseptic oral rinse  7 mL Mouth Rinse QID  . azithromycin  500 mg Intravenous Q24H  . ceFEPime (MAXIPIME) IV  1 g Intravenous Q24H  . chlorhexidine  15 mL Mouth Rinse BID  . hydrocortisone sodium succinate  50 mg Intravenous Q6H  . levothyroxine  50 mcg Oral QAC breakfast  . pantoprazole (PROTONIX) IV  40 mg Intravenous Q24H  . sodium chloride  10-40 mL Intracatheter Q12H  . vancomycin  750 mg Intravenous Q48H    Continuous Infusions: . sodium chloride    . sodium chloride 125 mL/hr at 01/18/15 0039  . epinephrine 4 mcg/min (01/18/15 0400)  . fentaNYL infusion  INTRAVENOUS 150 mcg/hr (01/18/15 0739)  . vasopressin (PITRESSIN) infusion - *FOR SHOCK*      Past Medical History  Diagnosis Date  . DIABETES MELLITUS, TYPE II 12/08/2006  . DUODENAL ULCER 12/08/2006  . THORACOTOMY, HX OF 02/27/1994    History reviewed. No pertinent past surgical history.  Christophe Louis RD, LDN Nutrition Pager: [phone] 01/18/2015 8:05 AM

## 2015-01-19 ENCOUNTER — Inpatient Hospital Stay (HOSPITAL_COMMUNITY): Payer: Medicare Other

## 2015-01-19 DIAGNOSIS — I480 Paroxysmal atrial fibrillation: Secondary | ICD-10-CM

## 2015-01-19 DIAGNOSIS — E035 Myxedema coma: Secondary | ICD-10-CM

## 2015-01-19 LAB — GLUCOSE, CAPILLARY
GLUCOSE-CAPILLARY: 120 mg/dL — AB (ref 70–99)
GLUCOSE-CAPILLARY: 126 mg/dL — AB (ref 70–99)
GLUCOSE-CAPILLARY: 130 mg/dL — AB (ref 70–99)
GLUCOSE-CAPILLARY: 55 mg/dL — AB (ref 70–99)
GLUCOSE-CAPILLARY: 84 mg/dL (ref 70–99)
Glucose-Capillary: 118 mg/dL — ABNORMAL HIGH (ref 70–99)
Glucose-Capillary: 67 mg/dL — ABNORMAL LOW (ref 70–99)
Glucose-Capillary: 89 mg/dL (ref 70–99)

## 2015-01-19 LAB — CBC WITH DIFFERENTIAL/PLATELET
BASOS ABS: 0 10*3/uL (ref 0.0–0.1)
Basophils Relative: 0 % (ref 0–1)
EOS PCT: 0 % (ref 0–5)
Eosinophils Absolute: 0 10*3/uL (ref 0.0–0.7)
HCT: 23.1 % — ABNORMAL LOW (ref 39.0–52.0)
HEMOGLOBIN: 7.6 g/dL — AB (ref 13.0–17.0)
LYMPHS PCT: 7 % — AB (ref 12–46)
Lymphs Abs: 0.4 10*3/uL — ABNORMAL LOW (ref 0.7–4.0)
MCH: 24.7 pg — ABNORMAL LOW (ref 26.0–34.0)
MCHC: 32.9 g/dL (ref 30.0–36.0)
MCV: 75 fL — AB (ref 78.0–100.0)
MONO ABS: 0.2 10*3/uL (ref 0.1–1.0)
MONOS PCT: 4 % (ref 3–12)
NEUTROS PCT: 89 % — AB (ref 43–77)
Neutro Abs: 5.6 10*3/uL (ref 1.7–7.7)
Platelets: 92 10*3/uL — ABNORMAL LOW (ref 150–400)
RBC: 3.08 MIL/uL — AB (ref 4.22–5.81)
RDW: 22.5 % — AB (ref 11.5–15.5)
WBC: 6.2 10*3/uL (ref 4.0–10.5)

## 2015-01-19 LAB — PROCALCITONIN: Procalcitonin: 2.92 ng/mL

## 2015-01-19 LAB — URINE CULTURE
COLONY COUNT: NO GROWTH
Culture: NO GROWTH
SPECIAL REQUESTS: NORMAL

## 2015-01-19 LAB — BASIC METABOLIC PANEL
Anion gap: 7 (ref 5–15)
BUN: 67 mg/dL — ABNORMAL HIGH (ref 6–23)
CO2: 20 mmol/L (ref 19–32)
CREATININE: 2.52 mg/dL — AB (ref 0.50–1.35)
Calcium: 8.2 mg/dL — ABNORMAL LOW (ref 8.4–10.5)
Chloride: 106 mmol/L (ref 96–112)
GFR calc Af Amer: 25 mL/min — ABNORMAL LOW (ref >90)
GFR calc non Af Amer: 21 mL/min — ABNORMAL LOW (ref >90)
GLUCOSE: 129 mg/dL — AB (ref 70–99)
Potassium: 4.3 mmol/L (ref 3.5–5.1)
Sodium: 133 mmol/L — ABNORMAL LOW (ref 135–145)

## 2015-01-19 LAB — CARBOXYHEMOGLOBIN
Carboxyhemoglobin: 1 % (ref 0.5–1.5)
METHEMOGLOBIN: 0.9 % (ref 0.0–1.5)
O2 Saturation: 63.7 %
Total hemoglobin: 8.4 g/dL — ABNORMAL LOW (ref 13.5–18.0)

## 2015-01-19 MED ORDER — ETOMIDATE 2 MG/ML IV SOLN
INTRAVENOUS | Status: AC
  Administered 2015-01-19: 20 mg
  Filled 2015-01-19: qty 10

## 2015-01-19 MED ORDER — DEXTROSE 50 % IV SOLN
INTRAVENOUS | Status: AC
  Administered 2015-01-19: 50 mL
  Filled 2015-01-19: qty 50

## 2015-01-19 MED ORDER — DOPAMINE-DEXTROSE 3.2-5 MG/ML-% IV SOLN
1.0000 ug/kg/min | INTRAVENOUS | Status: DC
  Administered 2015-01-19 – 2015-01-20 (×2): 5 ug/kg/min via INTRAVENOUS
  Administered 2015-01-23: 3 ug/kg/min via INTRAVENOUS
  Filled 2015-01-19 (×3): qty 250

## 2015-01-19 MED ORDER — SODIUM CHLORIDE 0.9 % IV BOLUS (SEPSIS)
500.0000 mL | Freq: Once | INTRAVENOUS | Status: AC
  Administered 2015-01-19: 500 mL via INTRAVENOUS

## 2015-01-19 MED ORDER — CLONAZEPAM 0.5 MG PO TABS
0.5000 mg | ORAL_TABLET | Freq: Two times a day (BID) | ORAL | Status: DC
  Administered 2015-01-19: 0.5 mg via ORAL
  Filled 2015-01-19: qty 1

## 2015-01-19 MED ORDER — DEXTROSE 50 % IV SOLN
25.0000 mL | Freq: Once | INTRAVENOUS | Status: AC
  Administered 2015-01-19: 25 mL via INTRAVENOUS
  Filled 2015-01-19: qty 50

## 2015-01-19 NOTE — Consult Note (Signed)
Referring Physician: CCM Reason for Consultation: Bradycardia   HPI:  81 year old male with DM2, HTN, CKD (baseline Cr 1.5-1.7), hypothyroidism, chronic systolic HF (EF 40%), dementia admitted from home on 2/19 after being found w/ altered mental status and increased labored breathing. Intubated. Admitted to ICU. CXR with ? L PNA. 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 Started on IV steroids and IV synthroid. Now DNR. We are asked to see due to bradycardia and ? Need for TVP.  Admitted in 9/15 with syncopal episode, encephalopathy and malnutrition. EF found to be 35-40% with severe LVH. Was in NSR at time. Treated medically. Seen last in [facility] clinic 09/06/14 and much improved. Son not sure how he has been doing since.   Currently on vent. Son in room. On ECG has slow AF with V-response of 45. Low volts. SBP 100 on levophed 10 and dopa just added. Now back in NSR with HR in 50-60s. Echo 45-50% with severe LVH. Increase pulm pressures. Trop 0.10.  CXR with left infiltrate vs atx. + moderate effusion    Review of Systems: Not available. Intubated sedated.   Past Medical History  Diagnosis Date  . DIABETES MELLITUS, TYPE II 12/08/2006  . DUODENAL ULCER 12/08/2006  . THORACOTOMY, HX OF 02/27/1994    Medications Prior to Admission  Medication Sig Dispense Refill  . aspirin 81 MG EC tablet TAKE 1 TABLET (81 MG TOTAL) BY MOUTH DAILY. 90 tablet 3  . atorvastatin (LIPITOR) 40 MG tablet TAKE 1 TABLET BY MOUTH EVERY DAY AT 6PM 90 tablet 3  . carvedilol (COREG) 6.25 MG tablet TAKE 1 TABLET BY MOUTH TWICE A DAY WITH A MEAL 180 tablet 3  . hydrALAZINE (APRESOLINE) 10 MG tablet TAKE 1 TABLET BY MOUTH EVERY 8 HOURS 270 tablet 3  . hydrochlorothiazide (HYDRODIURIL) 25 MG tablet Take 0.5 tablets (12.5 mg total) by mouth daily.    . isosorbide mononitrate (IMDUR) 15 mg TB24 24 hr tablet Take 0.5 tablets (15 mg total) by mouth daily. 30 tablet 1  . levothyroxine (SYNTHROID, LEVOTHROID) 50 MCG tablet Take  50 mcg by mouth daily.  5  . Vitamin D, Ergocalciferol, (DRISDOL) 50000 UNITS CAPS capsule Take 1 capsule by mouth once a week. Tuesday  0  . carbamide peroxide (DEBROX) 6.5 % otic solution Place 5 drops into both ears 2 (two) times daily. (Patient not taking: Reported on 24-Jan-2015) 15 mL 0     . antiseptic oral rinse  7 mL Mouth Rinse QID  . ceFEPime (MAXIPIME) IV  1 g Intravenous Q24H  . chlorhexidine  15 mL Mouth Rinse BID  . clonazePAM  0.5 mg Oral BID  . feeding supplement (VITAL HIGH PROTEIN)  1,000 mL Per Tube Q24H  . heparin subcutaneous  5,000 Units Subcutaneous 3 times per day  . hydrocortisone sodium succinate  50 mg Intravenous Q6H  . insulin aspart  0-9 Units Subcutaneous 6 times per day  . levothyroxine  100 mcg Intravenous Daily  . pantoprazole (PROTONIX) IV  40 mg Intravenous Q24H  . sodium chloride  500 mL Intravenous Once  . sodium chloride  10-40 mL Intracatheter Q12H    Infusions: . sodium chloride 50 mL/hr at 01/19/15 0800  . DOPamine 5 mcg/kg/min (01/19/15 0927)  . fentaNYL infusion INTRAVENOUS 75 mcg/hr (01/19/15 0800)  . norepinephrine (LEVOPHED) Adult infusion 10 mcg/min (01/19/15 0622)    No Known Allergies  History   Social History  . Marital Status: Widowed    Spouse Name:  N/A  . Number of Children: N/A  . Years of Education: N/A   Occupational History  . Not on file.   Social History Main Topics  . Smoking status: Former Games developer  . Smokeless tobacco: Never Used  . Alcohol Use: No  . Drug Use: No  . Sexual Activity: Not Currently   Other Topics Concern  . Not on file   Social History Narrative   Lives with daughter    FHx: unavailable. Intubated sedated.    PHYSICAL EXAM: Filed Vitals:   01/19/15 0810  BP: 119/64  Pulse: 65  Temp:   Resp:      Intake/Output Summary (Last 24 hours) at 01/19/15 0931 Last data filed at 01/19/15 0800  Gross per 24 hour  Intake 3644.73 ml  Output    115 ml  Net 3529.73 ml    General:   Elderly. Intubated/sedated Frail appearing on Bair hugger  Temp 36. CVP 5 HEENT: normal with ETT Neck: supple. Carotids 2+ bilat; no bruits. No lymphadenopathy or thryomegaly appreciated. Cor: PMI nondisplaced. Regular rate & rhythm. No rubs, gallops or murmurs. Lungs: bronchial breath sounds with crackles on left Abdomen: firm. No rebound. Hypoactive BS   Extremities: no cyanosis, clubbing, rash, 2+ edema Neuro: intubated/sedated   Results for orders placed or performed during the hospital encounter of 2015/02/11 (from the past 24 hour(s))  Procalcitonin - Baseline     Status: None   Collection Time: 01/18/15  9:45 AM  Result Value Ref Range   Procalcitonin 1.34 ng/mL  Glucose, capillary     Status: None   Collection Time: 01/18/15 12:22 PM  Result Value Ref Range   Glucose-Capillary 82 70 - 99 mg/dL   Comment 1 Arterial Specimen   Glucose, capillary     Status: None   Collection Time: 01/18/15  4:31 PM  Result Value Ref Range   Glucose-Capillary 77 70 - 99 mg/dL   Comment 1 Venous Specimen   Glucose, capillary     Status: Abnormal   Collection Time: 01/18/15  8:17 PM  Result Value Ref Range   Glucose-Capillary 125 (H) 70 - 99 mg/dL   Comment 1 Venous Specimen   Glucose, capillary     Status: Abnormal   Collection Time: 01/19/15 12:35 AM  Result Value Ref Range   Glucose-Capillary 120 (H) 70 - 99 mg/dL   Comment 1 Venous Specimen   Basic metabolic panel     Status: Abnormal   Collection Time: 01/19/15  5:00 AM  Result Value Ref Range   Sodium 133 (L) 135 - 145 mmol/L   Potassium 4.3 3.5 - 5.1 mmol/L   Chloride 106 96 - 112 mmol/L   CO2 20 19 - 32 mmol/L   Glucose, Bld 129 (H) 70 - 99 mg/dL   BUN 67 (H) 6 - 23 mg/dL   Creatinine, Ser 1.61 (H) 0.50 - 1.35 mg/dL   Calcium 8.2 (L) 8.4 - 10.5 mg/dL   GFR calc non Af Amer 21 (L) >90 mL/min   GFR calc Af Amer 25 (L) >90 mL/min   Anion gap 7 5 - 15  CBC with Differential/Platelet     Status: Abnormal   Collection Time:  01/19/15  5:00 AM  Result Value Ref Range   WBC 6.2 4.0 - 10.5 K/uL   RBC 3.08 (L) 4.22 - 5.81 MIL/uL   Hemoglobin 7.6 (L) 13.0 - 17.0 g/dL   HCT 09.6 (L) 04.5 - 40.9 %   MCV 75.0 (L) 78.0 - 100.0  fL   MCH 24.7 (L) 26.0 - 34.0 pg   MCHC 32.9 30.0 - 36.0 g/dL   RDW 16.1 (H) 09.6 - 04.5 %   Platelets 92 (L) 150 - 400 K/uL   Neutrophils Relative % 89 (H) 43 - 77 %   Lymphocytes Relative 7 (L) 12 - 46 %   Monocytes Relative 4 3 - 12 %   Eosinophils Relative 0 0 - 5 %   Basophils Relative 0 0 - 1 %   Neutro Abs 5.6 1.7 - 7.7 K/uL   Lymphs Abs 0.4 (L) 0.7 - 4.0 K/uL   Monocytes Absolute 0.2 0.1 - 1.0 K/uL   Eosinophils Absolute 0.0 0.0 - 0.7 K/uL   Basophils Absolute 0.0 0.0 - 0.1 K/uL   RBC Morphology ACANTHOCYTES   Procalcitonin     Status: None   Collection Time: 01/19/15  5:00 AM  Result Value Ref Range   Procalcitonin 2.92 ng/mL  Glucose, capillary     Status: Abnormal   Collection Time: 01/19/15  5:31 AM  Result Value Ref Range   Glucose-Capillary 130 (H) 70 - 99 mg/dL   Comment 1 Venous Specimen   Glucose, capillary     Status: Abnormal   Collection Time: 01/19/15  8:03 AM  Result Value Ref Range   Glucose-Capillary 118 (H) 70 - 99 mg/dL   Comment 1 Capillary Specimen    Ct Head Wo Contrast  01/27/15   CLINICAL DATA:  Altered mental status, unresponsive, found down by family this morning, question sepsis, history diabetes  EXAM: CT HEAD WITHOUT CONTRAST  TECHNIQUE: Contiguous axial images were obtained from the base of the skull through the vertex without intravenous contrast.  COMPARISON:  08/25/2014  FINDINGS: Motion artifacts, for which repeat imaging was performed.  Generalized atrophy.  Ex vacuo dilatation of the ventricular system.  No midline shift or mass effect.  Small vessel chronic ischemic changes of deep cerebral white matter.  No intracranial hemorrhage, mass lesion or evidence acute infarction.  No extra-axial fluid collections.  Chronic mucosal thickening with  thickening and sclerosis of the bony walls of the sphenoid sinus compatible with chronic sinusitis.  Tiny foci of gas are identified laterally at the RIGHT supraorbital region question laceration.  Bones and sinuses otherwise unremarkable.  Atherosclerotic calcifications at skullbase.  IMPRESSION: Atrophy with small vessel chronic ischemic changes of deep cerebral white matter.  No definite acute intracranial abnormalities.  Chronic sphenoid sinusitis.   Electronically Signed   By: Ulyses Southward M.D.   On: 27-Jan-2015 13:42   US Renal  01/18/2015   CLINICAL DATA:  Initial evaluation for renal failure and mild hematuria  EXAM: RENAL/URINARY TRACT ULTRASOUND COMPLETE  COMPARISON:  None.  FINDINGS: Right Kidney:  Length: 10.6 cm. Significantly echogenic kidney. 2.3 cm simple cyst lower pole.  Left Kidney:  Length: 9.2 cm. Left kidney is not well evaluated. It appears significantly echogenic in is difficult to characterize further.  Bladder:  Decompressed by Foley catheter and therefore not evaluated  There is a moderate volume of ascites.  IMPRESSION: Ascites.  Severe renal echogenicity bilaterally consistent with medical renal disease.   Electronically Signed   By: Esperanza Heir M.D.   On: 01/18/2015 21:53   Dg Chest Port 1 View  01/19/2015   CLINICAL DATA:  Pneumonia, diabetes, essential hypertension  EXAM: PORTABLE CHEST - 1 VIEW  COMPARISON:  Portable exam 0631 hr compared to 01-27-2015  FINDINGS: RIGHT jugular central venous catheter with tip projecting over SVC.  Nasogastric tube extends into stomach.  Tip of endotracheal tube projects 7.5 cm above carina.  Enlargement of cardiac silhouette.  Mediastinal contours normal.  Increased LEFT pleural effusion and LEFT lung atelectasis.  RIGHT lung clear.  No pneumothorax.  IMPRESSION: Moderate LEFT pleural effusion increased since previous exam with increased LEFT lung atelectasis.  Enlargement of cardiac silhouette.   Electronically Signed   By: Ulyses Southward M.D.    On: 01/19/2015 07:39   Dg Chest Port 1 View  02/10/2015   CLINICAL DATA:  Intubation, diabetes type 2, congestive dilated cardiomyopathy, essential hypertension  EXAM: PORTABLE CHEST - 1 VIEW  COMPARISON:  Portable exam 1428 hours compared to Feb 10, 2015 at 1205 hours  FINDINGS: Tip of endotracheal tube projects 4.7 cm above carina.  Nasogastric tube extends into abdomen.  RIGHT jugular central venous catheter tip projects over SVC.  Numerous EKG leads and external pacing leads noted.  Normal heart size, mediastinal contours and pulmonary vascularity.  Moderate LEFT pleural effusion and basilar atelectasis.  Skin fold projects over RIGHT upper lobe.  No definite infiltrate, pleural effusion or pneumothorax.  IMPRESSION: Line and tube positions as above.  Moderate LEFT pleural effusion and mild basilar atelectasis.   Electronically Signed   By: Ulyses Southward M.D.   On: Feb 10, 2015 14:45   Dg Chest Portable 1 View  10-Feb-2015   CLINICAL DATA:  Altered mental status.  Bradycardia  EXAM: PORTABLE CHEST - 1 VIEW  COMPARISON:  08/19/2015  FINDINGS: The endotracheal tube tip is just above the carina and is directed towards the right mainstem bronchus. This may need to be withdrawn by 1-2 cm. There is a nasogastric tube with tip below the GE junction. Normal heart size. There are bilateral pleural effusions noted left greater night.  IMPRESSION: 1. ET tube tip is at the carina and is directed towards the right mainstem bronchus. Recommend withdrawal and by 1-2 cm. 2. Bilateral pleural effusions left greater night.   Electronically Signed   By: Signa Kell M.D.   On: 2015/02/10 12:26     ASSESSMENT: 1. VDRF 2. Acute respiratory failure 3. Myxedema coma 4. Probable left-sided PNA 5. AF (new) with slow RVR 6. Chronic systolic/diastolic HF with severe LVH 7. Acute on chronic renal failure, now stage IV 8. Dementia 9. DNR  PLAN/DISCUSSION:  He is critically ill with multiple issues in setting of myxedema  coma and possible PNA.  He had transient AF with slow VR. Now back in NSR with HRs in 50-60s. No need for TVP at this point. Would continue to treat hypothyroidism as you are doing. Continue levophed/dopamine for HR and BP support as needed. Be careful with fluids given systolic and diastolic dysfunction. CVP ok now. Will check co-ox. Echo reviewed personally. Severe LVH likely due to longstanding HTN but with low volts on ECG and renal failure may also have undiagnosed amyloidosis. Consider SPEP/UPEP but would not change management.   Will check KUB given abdominal exam. Would not anticoagulate for AF at this point unless it recurs.   Prognosis quite poor.   D/w Dr. Tyson Alias.  The patient is critically ill with multiple organ systems failure and requires high complexity decision making for assessment and support, frequent evaluation and titration of therapies, application of advanced monitoring technologies and extensive interpretation of multiple databases.   Critical Care Time devoted to patient care services described in this note is 35 Minutes.  Cobey Raineri,MD 9:59 AM

## 2015-01-19 NOTE — Progress Notes (Signed)
Patient was noted to have cuff leak, even with a high cuff pressure.  MD changed out ETT with bougie without any complications.  Positive color change.  Bilateral breath sounds noted.  CXR pending.  Will continue to monitor.

## 2015-01-19 NOTE — Progress Notes (Signed)
PULMONARY / CRITICAL CARE MEDICINE   Name: Timothy Mayo MRN: [id number] DOB: 06/10/1925    ADMISSION DATE:  02-09-2015 CONSULTATION DATE:  2/19  REFERRING MD :  Gilmore Laroche  CHIEF COMPLAINT:  Acute respiratory failure septic shock   INITIAL PRESENTATION:  81 year old male admitted from home on 2/19 after being found w/ altered mental status and increased labored breathing. Admitted to ICU w/ working dx of acute respiratory failure in setting of CAP w/ resultant septic shock/MODS.   STUDIES:  CT head 2/19: NAD Echo 10-Feb-2023 45-50% global, pa pressure 65 mod pa htn  SIGNIFICANT EVENTS: 2/19: discussed prognosis w/ family. Made DNR in the event of cardiac arrest but otherwise aggressive care.  2023/02/10- TSH over 80, IV synthroid , steroids started 10-Feb-2023 renal US- ascites, med renal dz  SUBJECTIVE:  Remains brady 50's  VITAL SIGNS: Temp:  [95.9 F (35.5 C)-99.3 F (37.4 C)] 96.8 F (36 C) (02/21 0800) Pulse Rate:  [25-68] 65 (02/21 0810) Resp:  [12-20] 12 (02/21 0800) BP: (66-144)/(35-96) 119/64 mmHg (02/21 0810) SpO2:  [62 %-100 %] 100 % (02/21 0800) FiO2 (%):  [40 %] 40 % (02/21 0810) Weight:  [78.7 kg (173 lb 8 oz)] 78.7 kg (173 lb 8 oz) (02/21 0400) HEMODYNAMICS: CVP:  [5 mmHg-15 mmHg] 5 mmHg VENTILATOR SETTINGS: Vent Mode:  [-] PRVC FiO2 (%):  [40 %] 40 % Set Rate:  [12 bmp-16 bmp] 12 bmp Vt Set:  [600 mL] 600 mL PEEP:  [5 cmH20] 5 cmH20 Plateau Pressure:  [23 cmH20-29 cmH20] 29 cmH20 INTAKE / OUTPUT:  Intake/Output Summary (Last 24 hours) at 01/19/15 0829 Last data filed at 01/19/15 0800  Gross per 24 hour  Intake 3778.9 ml  Output    175 ml  Net 3603.9 ml    PHYSICAL EXAMINATION: General:  sedated Neuro:  rass 0, not following commands HEENT:  MM dry, no JVD NCAT Cardiovascular:  rrr s1 s 2 Lungs:  ronchi throughout, unchanged Abdomen:  NT + bowel sounds, wnl  Musculoskeletal:  Intact  Skin:  3 plus edema, pitting   LABS:  CBC  Recent Labs Lab  February 09, 2015 1315 2015/02/10 0330 01/19/15 0500  WBC 3.5* 7.0 6.2  HGB 7.5* 7.5* 7.6*  HCT 23.0* 23.2* 23.1*  PLT 100* 130* PENDING   Coag's  Recent Labs Lab 02-09-15 1315  APTT 30  INR 1.24   BMET  Recent Labs Lab February 09, 2015 1315 10-Feb-2015 0330 01/19/15 0500  NA 133* 137 133*  K 4.4 4.6 4.3  CL 102 107 106  CO2 26 20 20   BUN 56* 60* 67*  CREATININE 2.32* 2.43* 2.52*  GLUCOSE 133* 83 129*   Electrolytes  Recent Labs Lab 02/09/2015 1315 2015/02/10 0330 01/19/15 0500  CALCIUM 8.3* 8.4 8.2*  MG 1.9 1.7  --   PHOS 3.2 3.1  --    Sepsis Markers  Recent Labs Lab 02-09-2015 1228 02/09/15 1316 02-10-15 0945 01/19/15 0500  LATICACIDVEN 2.17* 1.8  --   --   PROCALCITON  --   --  1.34 2.92   ABG  Recent Labs Lab 02-09-15 1439  PHART 7.488*  PCO2ART 28.6*  PO2ART 528.0*   Liver Enzymes  Recent Labs Lab 09-Feb-2015 1134 09-Feb-2015 1315  AST 148* 170*  ALT 79* 90*  ALKPHOS 141* 148*  BILITOT 0.9 1.2  ALBUMIN 2.8* 2.7*   Cardiac Enzymes  Recent Labs Lab Feb 09, 2015 1315 02/09/15 1918 02-10-2015 0234  TROPONINI 0.08* 0.08* 0.10*   Glucose  Recent Labs Lab February 10, 2015 6681  01/18/15 1222 01/18/15 1631 01/18/15 2017 01/19/15 0035 01/19/15 0531  GLUCAP 86 82 77 125* 120* 130*    Imaging US Renal  01/18/2015   CLINICAL DATA:  Initial evaluation for renal failure and mild hematuria  EXAM: RENAL/URINARY TRACT ULTRASOUND COMPLETE  COMPARISON:  None.  FINDINGS: Right Kidney:  Length: 10.6 cm. Significantly echogenic kidney. 2.3 cm simple cyst lower pole.  Left Kidney:  Length: 9.2 cm. Left kidney is not well evaluated. It appears significantly echogenic in is difficult to characterize further.  Bladder:  Decompressed by Foley catheter and therefore not evaluated  There is a moderate volume of ascites.  IMPRESSION: Ascites.  Severe renal echogenicity bilaterally consistent with medical renal disease.   Electronically Signed   By: Esperanza Heir M.D.   On: 01/18/2015  21:53    ASSESSMENT / PLAN:  PULMONARY OETT 2/19 A: acute hypoxic respiratory Failure     CAP (left), effusion P:   May need to [place] left chest Consider wean attempt with close observation TV , apnea alarms Even balance goals pcxr in am   CARDIOVASCULAR CVL right IJ 2/19>>> A:  Septic shock Bradycardia - myxedema coma Presumed adrenal insuff Mild troponin bump-->possible demand ischemia  mod PA htn Global reduction EF - myxedema likely cause P:  Stress dose steroids remain for myxedema associated AI cvp 5, accuracy?, reluctant to allow gross positive balance Bolus once and assess response Pressors for MAP >60 Add dop at 5 mics Levo to map above Likely a pacer temp wire would not improve BP, will discuss with cards given TSH, however his overall outcome and futility in question as far as aggressive easures  RENAL A:   AKI (acute on chronic renal failure w/ BL sCr 1.5 to 1.6 range) - ATN sepsis Mild hematuria  H/o BPH Metabolic acidosis  Mild hypomagnesemia P:   Chem in am  Bolus and assess responsiveness Assess urine anal, urine osm, na for volume status  GASTROINTESTINAL A:   Weight loss Protein calorie malnutrition ascites P:   Tolerating tube feeds ppi  HEMATOLOGIC A:   Anemia of chronic disease w/out s/sx of bleeding  P:   heparin  Trend cbc in am   INFECTIOUS A:   CAP Septic shock  P:  BCx2 2/19>>> UC 2/19>>> Sputum 2/19>>> RVP 2/19>>> vanc 2/19>>>2/21 Zosyn 2/19>>>2/19 Cefepime 2/19>>> azithro 2/19>>>2/21  Remains culture neg May need to [place] left chest Dc vanc, azithro Pct assessment noted  ENDOCRINE A:   Hypothyroidism with mxyedma coma H/o DM  P:   Continued IV 100 mcg synthroid, did receive bolus NO t3, at this age with pa htn and other associated valvular dz - high risk VT , death  NEUROLOGIC A:   Acute Encephalopathy  H/o memory loss P:   RASS goal: -2 PAD protocol  Supportive care  Reduce klonopin started 2/21,  dc as able with age and risk delerium  FAMILY  - Updates: none noted - Inter-disciplinary family meet or Palliative Care meeting due by:  2/26   Ccm time 30 min   Mcarthur Rossetti. Tyson Alias, MD, FACP Pgr: [phone] [facility]

## 2015-01-19 NOTE — Procedures (Signed)
Intubation Procedure Note Timothy Mayo [id number] 23-Aug-1925  Procedure: Intubation Indications: Respiratory insufficiency  Procedure Details Consent: Unable to obtain consent because of emergent medical necessity. Time Out: Verified patient identification, verified procedure, site/side was marked, verified correct patient position, special equipment/implants available, medications/allergies/relevent history reviewed, required imaging and test results available.  Performed  Maximum sterile technique was used including gloves, gown and hand hygiene.  MAC and 4    Evaluation Hemodynamic Status: BP stable throughout; O2 sats: stable throughout Patient's Current Condition: stable Complications: No apparent complications Patient did tolerate procedure well. Chest X-ray ordered to verify placement.  CXR: pending.   Nelda Bucks 01/19/2015  Leak Placed bouche, removed old tube Placed new tube and direct visualized with mach 4 No resistance

## 2015-01-19 NOTE — Progress Notes (Signed)
Pt core temp at 35.7C. Bair hugger applied. Will continue to monitor.

## 2015-01-19 NOTE — Progress Notes (Signed)
Pt. Penis is really swollen. Bladder scanned to see if there was any obstruction in bladder. Irrigated foley.Marland Kitchen

## 2015-01-20 ENCOUNTER — Inpatient Hospital Stay (HOSPITAL_COMMUNITY): Payer: Medicare Other

## 2015-01-20 DIAGNOSIS — J939 Pneumothorax, unspecified: Secondary | ICD-10-CM | POA: Insufficient documentation

## 2015-01-20 DIAGNOSIS — N19 Unspecified kidney failure: Secondary | ICD-10-CM

## 2015-01-20 DIAGNOSIS — E035 Myxedema coma: Secondary | ICD-10-CM | POA: Diagnosis present

## 2015-01-20 DIAGNOSIS — J90 Pleural effusion, not elsewhere classified: Secondary | ICD-10-CM

## 2015-01-20 LAB — LACTATE DEHYDROGENASE, PLEURAL OR PERITONEAL FLUID: LD FL: 83 U/L — AB (ref 3–23)

## 2015-01-20 LAB — COMPREHENSIVE METABOLIC PANEL
ALBUMIN: 2.5 g/dL — AB (ref 3.5–5.2)
ALK PHOS: 115 U/L (ref 39–117)
ALT: 57 U/L — ABNORMAL HIGH (ref 0–53)
ANION GAP: 8 (ref 5–15)
AST: 70 U/L — AB (ref 0–37)
BUN: 78 mg/dL — AB (ref 6–23)
CALCIUM: 8.3 mg/dL — AB (ref 8.4–10.5)
CHLORIDE: 105 mmol/L (ref 96–112)
CO2: 21 mmol/L (ref 19–32)
Creatinine, Ser: 2.58 mg/dL — ABNORMAL HIGH (ref 0.50–1.35)
GFR calc non Af Amer: 21 mL/min — ABNORMAL LOW (ref >90)
GFR, EST AFRICAN AMERICAN: 24 mL/min — AB (ref >90)
Glucose, Bld: 141 mg/dL — ABNORMAL HIGH (ref 70–99)
Potassium: 4.1 mmol/L (ref 3.5–5.1)
SODIUM: 134 mmol/L — AB (ref 135–145)
Total Bilirubin: 0.8 mg/dL (ref 0.3–1.2)
Total Protein: 5.2 g/dL — ABNORMAL LOW (ref 6.0–8.3)

## 2015-01-20 LAB — CBC WITH DIFFERENTIAL/PLATELET
BASOS ABS: 0 10*3/uL (ref 0.0–0.1)
BASOS PCT: 0 % (ref 0–1)
Eosinophils Absolute: 0 10*3/uL (ref 0.0–0.7)
Eosinophils Relative: 0 % (ref 0–5)
HCT: 25.3 % — ABNORMAL LOW (ref 39.0–52.0)
Hemoglobin: 8.3 g/dL — ABNORMAL LOW (ref 13.0–17.0)
Lymphocytes Relative: 3 % — ABNORMAL LOW (ref 12–46)
Lymphs Abs: 0.2 10*3/uL — ABNORMAL LOW (ref 0.7–4.0)
MCH: 24.7 pg — AB (ref 26.0–34.0)
MCHC: 32.8 g/dL (ref 30.0–36.0)
MCV: 75.3 fL — ABNORMAL LOW (ref 78.0–100.0)
Monocytes Absolute: 0.1 10*3/uL (ref 0.1–1.0)
Monocytes Relative: 2 % — ABNORMAL LOW (ref 3–12)
NEUTROS PCT: 95 % — AB (ref 43–77)
Neutro Abs: 6.8 10*3/uL (ref 1.7–7.7)
Platelets: 105 10*3/uL — ABNORMAL LOW (ref 150–400)
RBC: 3.36 MIL/uL — AB (ref 4.22–5.81)
RDW: 22.2 % — ABNORMAL HIGH (ref 11.5–15.5)
WBC: 7.1 10*3/uL (ref 4.0–10.5)

## 2015-01-20 LAB — URINALYSIS, ROUTINE W REFLEX MICROSCOPIC
Glucose, UA: 100 mg/dL — AB
KETONES UR: 15 mg/dL — AB
Nitrite: POSITIVE — AB
Protein, ur: >300 mg/dL — AB
SPECIFIC GRAVITY, URINE: 1.025 (ref 1.005–1.030)
Urobilinogen, UA: 1 mg/dL (ref 0.0–1.0)
pH: 6.5 (ref 5.0–8.0)

## 2015-01-20 LAB — CULTURE, RESPIRATORY

## 2015-01-20 LAB — GLUCOSE, SEROUS FLUID: Glucose, Fluid: 157 mg/dL

## 2015-01-20 LAB — GLUCOSE, CAPILLARY
GLUCOSE-CAPILLARY: 97 mg/dL (ref 70–99)
GLUCOSE-CAPILLARY: 144 mg/dL — AB (ref 70–99)
Glucose-Capillary: 156 mg/dL — ABNORMAL HIGH (ref 70–99)
Glucose-Capillary: 158 mg/dL — ABNORMAL HIGH (ref 70–99)
Glucose-Capillary: 154 mg/dL — ABNORMAL HIGH (ref 70–99)

## 2015-01-20 LAB — OSMOLALITY, URINE: Osmolality, Ur: 323 mOsm/kg — ABNORMAL LOW (ref 390–1090)

## 2015-01-20 LAB — BODY FLUID CELL COUNT WITH DIFFERENTIAL
EOS FL: 0 %
Lymphs, Fluid: 64 %
Monocyte-Macrophage-Serous Fluid: 6 % — ABNORMAL LOW (ref 50–90)
NEUTROPHIL FLUID: 30 % — AB (ref 0–25)
Total Nucleated Cell Count, Fluid: 60 cu mm (ref 0–1000)

## 2015-01-20 LAB — PROTEIN, BODY FLUID: Total protein, fluid: <3 g/dL

## 2015-01-20 LAB — URINE MICROSCOPIC-ADD ON

## 2015-01-20 LAB — PROCALCITONIN: Procalcitonin: 2.47 ng/mL

## 2015-01-20 LAB — SODIUM, URINE, RANDOM: SODIUM UR: 52 mmol/L

## 2015-01-20 LAB — CULTURE, RESPIRATORY W GRAM STAIN

## 2015-01-20 MED ORDER — FUROSEMIDE 10 MG/ML IJ SOLN
40.0000 mg | Freq: Once | INTRAMUSCULAR | Status: AC
  Administered 2015-01-20: 40 mg via INTRAVENOUS
  Filled 2015-01-20: qty 4

## 2015-01-20 MED ORDER — MIDAZOLAM HCL 2 MG/2ML IJ SOLN
1.0000 mg | INTRAMUSCULAR | Status: DC | PRN
  Administered 2015-01-20 – 2015-01-21 (×2): 1 mg via INTRAVENOUS
  Filled 2015-01-20 (×2): qty 2

## 2015-01-20 MED ORDER — MIDAZOLAM HCL 2 MG/2ML IJ SOLN: 1.0000 mg | INTRAMUSCULAR | Status: DC | PRN

## 2015-01-20 NOTE — Progress Notes (Signed)
ANTIBIOTIC CONSULT NOTE - FOLLOW UP  Pharmacy Consult for Cefepime Indication: PNA  No Known Allergies  Patient Measurements: Height: 5\' 9"  (175.3 cm) Weight: 173 lb 8 oz (78.7 kg) IBW/kg (Calculated) : 70.7  Vital Signs: Temp: 99 F (37.2 C) (02/22 0600) Temp Source: Core (Comment) (02/22 0415) BP: 125/60 mmHg (02/22 0600) Pulse Rate: 56 (02/22 0600) Intake/Output from previous day: 02/21 0701 - 02/22 0700 In: 3370.7 [I.V.:2006; NG/GT:1344.8] Out: 660 [Urine:660] Intake/Output from this shift:    Labs:  Recent Labs  01/18/15 0330 01/19/15 0500 01/20/15 0435  WBC 7.0 6.2 7.1  HGB 7.5* 7.6* 8.3*  PLT 130* 92* PENDING  CREATININE 2.43* 2.52* 2.58*   Estimated Creatinine Clearance: 20.2 mL/min (by C-G formula based on Cr of 2.58). No results for input(s): VANCOTROUGH, VANCOPEAK, VANCORANDOM, GENTTROUGH, GENTPEAK, GENTRANDOM, TOBRATROUGH, TOBRAPEAK, TOBRARND, AMIKACINPEAK, AMIKACINTROU, AMIKACIN in the last 72 hours.   Microbiology: Recent Results (from the past 720 hour(s))  Culture, blood (routine x 2)     Status: None (Preliminary result)   Collection Time: 02/02/15 11:59 AM  Result Value Ref Range Status   Specimen Description BLOOD RIGHT FOREARM  Final   Special Requests BOTTLES DRAWN AEROBIC ONLY 5CC  Final   Culture   Final           BLOOD CULTURE RECEIVED NO GROWTH TO DATE CULTURE WILL BE HELD FOR 5 DAYS BEFORE ISSUING A FINAL NEGATIVE REPORT Performed at [facility]    Report Status PENDING  Incomplete  Culture, blood (routine x 2)     Status: None (Preliminary result)   Collection Time: 02/02/15 12:02 PM  Result Value Ref Range Status   Specimen Description BLOOD LEFT FOREARM  Final   Special Requests BOTTLES DRAWN AEROBIC ONLY 3CC  Final   Culture   Final           BLOOD CULTURE RECEIVED NO GROWTH TO DATE CULTURE WILL BE HELD FOR 5 DAYS BEFORE ISSUING A FINAL NEGATIVE REPORT Performed at [facility]    Report Status PENDING   Incomplete  Culture, blood (routine x 2)     Status: None (Preliminary result)   Collection Time: 2015-02-02  1:27 PM  Result Value Ref Range Status   Specimen Description BLOOD CENTRAL LINE  Final   Special Requests BOTTLES DRAWN AEROBIC AND ANAEROBIC 10CC  Final   Culture   Final           BLOOD CULTURE RECEIVED NO GROWTH TO DATE CULTURE WILL BE HELD FOR 5 DAYS BEFORE ISSUING A FINAL NEGATIVE REPORT Performed at [facility]    Report Status PENDING  Incomplete  Culture, respiratory (NON-Expectorated)     Status: None (Preliminary result)   Collection Time: 2015/02/02  1:53 PM  Result Value Ref Range Status   Specimen Description TRACHEAL ASPIRATE  Final   Special Requests NONE  Final   Gram Stain   Final    MODERATE WBC PRESENT,BOTH PMN AND MONONUCLEAR RARE SQUAMOUS EPITHELIAL CELLS PRESENT RARE GRAM POSITIVE COCCI IN PAIRS Performed at [facility]    Culture   Final    Non-Pathogenic Oropharyngeal-type Flora Isolated. Performed at [facility]    Report Status PENDING  Incomplete  MRSA PCR Screening     Status: None   Collection Time: February 02, 2015  4:12 PM  Result Value Ref Range Status   MRSA by PCR NEGATIVE NEGATIVE Final    Comment:        The GeneXpert MRSA Assay (  FDA approved for NASAL specimens only), is one component of a comprehensive MRSA colonization surveillance program. It is not intended to diagnose MRSA infection nor to guide or monitor treatment for MRSA infections.   Culture, blood (x 2)     Status: None (Preliminary result)   Collection Time: 01/12/2015  4:30 PM  Result Value Ref Range Status   Specimen Description BLOOD RIGHT HAND  Final   Special Requests BOTTLES DRAWN AEROBIC ONLY 3CC  Final   Culture   Final           BLOOD CULTURE RECEIVED NO GROWTH TO DATE CULTURE WILL BE HELD FOR 5 DAYS BEFORE ISSUING A FINAL NEGATIVE REPORT Performed at [facility]    Report Status PENDING  Incomplete  Urine culture     Status:  None   Collection Time: 01/18/15 10:39 AM  Result Value Ref Range Status   Specimen Description URINE, CATHETERIZED  Final   Special Requests Normal  Final   Colony Count NO GROWTH Performed at [facility]   Final   Culture NO GROWTH Performed at [facility]   Final   Report Status 01/19/2015 FINAL  Final    Medical History: Past Medical History  Diagnosis Date  . DIABETES MELLITUS, TYPE II 12/08/2006  . DUODENAL ULCER 12/08/2006  . THORACOTOMY, HX OF 02/27/1994    Medications:  Prescriptions prior to admission  Medication Sig Dispense Refill Last Dose  . aspirin 81 MG EC tablet TAKE 1 TABLET (81 MG TOTAL) BY MOUTH DAILY. 90 tablet 3 01/10/2015 at Unknown time  . atorvastatin (LIPITOR) 40 MG tablet TAKE 1 TABLET BY MOUTH EVERY DAY AT 6PM 90 tablet 3 01/16/2015 at Unknown time  . carvedilol (COREG) 6.25 MG tablet TAKE 1 TABLET BY MOUTH TWICE A DAY WITH A MEAL 180 tablet 3 01/07/2015 at 0900  . hydrALAZINE (APRESOLINE) 10 MG tablet TAKE 1 TABLET BY MOUTH EVERY 8 HOURS 270 tablet 3 01/15/2015 at Unknown time  . hydrochlorothiazide (HYDRODIURIL) 25 MG tablet Take 0.5 tablets (12.5 mg total) by mouth daily.   01/12/2015 at Unknown time  . isosorbide mononitrate (IMDUR) 15 mg TB24 24 hr tablet Take 0.5 tablets (15 mg total) by mouth daily. 30 tablet 1 01/16/2015 at Unknown time  . levothyroxine (SYNTHROID, LEVOTHROID) 50 MCG tablet Take 50 mcg by mouth daily.  5 01/19/2015 at Unknown time  . Vitamin D, Ergocalciferol, (DRISDOL) 50000 UNITS CAPS capsule Take 1 capsule by mouth once a week. Tuesday  0 01/14/2015  . carbamide peroxide (DEBROX) 6.5 % otic solution Place 5 drops into both ears 2 (two) times daily. (Patient not taking: Reported on 01/26/2015) 15 mL 0 Not Taking at Unknown time     Assessment: 81 yo M presented on 2/19 acute respiratory failure and septic shock. PMH includes DM, HTN, and some memory problems. Patient was on Vancomycin, Cefepime and Azithromycin for  CAP/Sepsis. Vanc and Azithromycin were stopped on 2/21 for culture negative CAP. WBC remains wnl. Pt is afebrile. SCr remains elevated. CXR on 2/22 shows persistent left lung consolidation   2/19 trach aspirate: rare gm pos in pairs  2/19 BCx: ngtd 2/20 UCx: neg  2/20 RSV:  Azith 2/19 >> 2/21 Cefepime 2/19 >> Vanc 2/19 >> 2/21   Goal of Therapy:  Vancomycin trough level 15-20 mcg/ml  Resolution of infection  Plan:  Continue cefepime 1g IV Q24 Monitor renal function, clinical picture F/U renal function trend to change doses Monitor trach aspirate culture  Albertina Parr, PharmD., BCPS Clinical Pharmacist Pager [phone]

## 2015-01-20 NOTE — Progress Notes (Signed)
FPTS Interim Progress Note  S: Patient sedated, on Fentanyl and Levophed .  Per RN, "not doing much".  Patient on respiratory precautions, being tested for flu.    O: BP 125/60 mmHg  Pulse 56  Temp(Src) 99 F (37.2 C) (Core (Comment))  Resp 12  Ht 5\' 9"  (1.753 m)  Wt 173 lb 8 oz (78.7 kg)  BMI 25.61 kg/m2  SpO2 96%  Gen: sleeping in bed, intubated Neuro: sedated  A/P: Timothy Mayo is an 81 year old male admitted from home on 2/19 after being found w/ altered mental status and increased labored breathing. Patient is currently being treated in the ICU for working dx of acute respiratory failure in setting of CAP w/ resultant septic shock/MODS.  -Appreciate excellent care being provided by CCM.  Will continue to follow patient socially and accept him to the FPTS once appropriate.   Raliegh Ip, DO 01/20/2015, 7:14 AM PGY-1, [facility]

## 2015-01-20 NOTE — Progress Notes (Signed)
eLink Physician-Brief Progress Note Patient Name: Timothy Mayo DOB: [birth year] MRN: [id number]   Date of Service  01/20/2015  HPI/Events of Note  Nurse at bedside concerned that patient is not adequately sedated on fentanyl gtt alone.  Currently on 100 mcg fentanyl, chewing on ETT.  eICU Interventions  Plan: Order for PRN versed        [name] 01/20/2015, 11:15 PM

## 2015-01-20 NOTE — Progress Notes (Signed)
E-link physician, Dr. Vassie Loll, made aware of pt scrotal and penis swelling. No urine output throughout the last four hours despite irrigating. Blood tingled urine noted to have leaked around foley. Bladder scan completed with >159ml in bladder. Awaiting Dr. Delton Coombes and Dr. Vassie Loll for further orders.

## 2015-01-20 NOTE — Progress Notes (Signed)
PULMONARY / CRITICAL CARE MEDICINE   Name: Timothy Mayo MRN: [id number] DOB: 1925/11/09    ADMISSION DATE:  01/06/2015 CONSULTATION DATE:  2/19  REFERRING MD :  Gilmore Laroche  CHIEF COMPLAINT:  Acute respiratory failure septic shock   INITIAL PRESENTATION:  81 year old male admitted from home on 2/19 after being found w/ altered mental status and increased labored breathing. Admitted to ICU w/ working dx of acute respiratory failure in setting of L CAP w/ resultant septic shock/MODS. W/u revealed severe hypothyroidism and apparent myxedema coma  STUDIES:  CT head 2/19: NAD Echo February 07, 2023 45-50% global, pa pressure 65 mod pa htn  SIGNIFICANT EVENTS: 2/19: discussed prognosis w/ family. Made DNR in the event of cardiac arrest but otherwise aggressive care.  2023-02-07- TSH over 80, IV synthroid , steroids started 2023/02/07 renal US- ascites, med renal dz  SUBJECTIVE:  Note hypothyroidism and improvement in bradycardia with initiation synthroid.    VITAL SIGNS: Temp:  [97.3 F (36.3 C)-99.9 F (37.7 C)] 98.1 F (36.7 C) (02/22 1000) Pulse Rate:  [50-95] 56 (02/22 1000) Resp:  [11-16] 12 (02/22 1000) BP: (99-156)/(53-85) 109/56 mmHg (02/22 1000) SpO2:  [95 %-100 %] 100 % (02/22 1000) FiO2 (%):  [40 %] 40 % (02/22 0852) Weight:  [78.7 kg (173 lb 8 oz)] 78.7 kg (173 lb 8 oz) (02/22 0456) HEMODYNAMICS: CVP:  [3 mmHg-19 mmHg] 10 mmHg VENTILATOR SETTINGS: Vent Mode:  [-] PRVC FiO2 (%):  [40 %] 40 % Set Rate:  [12 bmp] 12 bmp Vt Set:  [600 mL] 600 mL PEEP:  [5 cmH20] 5 cmH20 Plateau Pressure:  [27 cmH20-34 cmH20] 32 cmH20 INTAKE / OUTPUT:  Intake/Output Summary (Last 24 hours) at 01/20/15 1040 Last data filed at 01/20/15 1000  Gross per 24 hour  Intake 3273.75 ml  Output    760 ml  Net 2513.75 ml    PHYSICAL EXAMINATION: General:  sedated Neuro:  rass 0, not following commands HEENT:  MM dry, no JVD NCAT Cardiovascular:  rrr s1 s 2 Lungs:  ronchi throughout, unchanged Abdomen:  NT +  bowel sounds, wnl  Musculoskeletal:  Intact  Skin:  3 plus edema, pitting   LABS:  CBC  Recent Labs Lab 2015/02/07 0330 01/19/15 0500 01/20/15 0435  WBC 7.0 6.2 7.1  HGB 7.5* 7.6* 8.3*  HCT 23.2* 23.1* 25.3*  PLT 130* 92* 105*   Coag's  Recent Labs Lab 01/24/2015 1315  APTT 30  INR 1.24   BMET  Recent Labs Lab 02/07/15 0330 01/19/15 0500 01/20/15 0435  NA 137 133* 134*  K 4.6 4.3 4.1  CL 107 106 105  CO2 BUN 60* 67* 78*  CREATININE 2.43* 2.52* 2.58*  GLUCOSE 83 129* 141*   Electrolytes  Recent Labs Lab 12/30/2014 1315 07-Feb-2015 0330 01/19/15 0500 01/20/15 0435  CALCIUM 8.3* 8.4 8.2* 8.3*  MG 1.9 1.7  --   --   PHOS 3.2 3.1  --   --    Sepsis Markers  Recent Labs Lab 01/24/2015 1228 01/23/2015 1316 02/07/2015 0945 01/19/15 0500 01/20/15 0435  LATICACIDVEN 2.17* 1.8  --   --   --   PROCALCITON  --   --  1.34 2.92 2.47   ABG  Recent Labs Lab 01/03/2015 1439  PHART 7.488*  PCO2ART 28.6*  PO2ART 528.0*   Liver Enzymes  Recent Labs Lab 01/26/2015 1134 01/12/2015 1315 01/20/15 0435  AST 148* 170* 70*  ALT 79* 90* 57*  ALKPHOS 141* 148* 115  BILITOT 0.9 1.2 0.8  ALBUMIN 2.8* 2.7* 2.5*   Cardiac Enzymes  Recent Labs Lab February 10, 2015 1315 10-Feb-2015 1918 01/18/15 0234  TROPONINI 0.08* 0.08* 0.10*   Glucose  Recent Labs Lab 01/19/15 1513 01/19/15 1555 01/19/15 1915 01/19/15 2023 01/19/15 2303 01/20/15 0818  GLUCAP 67* 84 55* 89 97 156*    Imaging Dg Chest Port 1 View  01/19/2015   CLINICAL DATA:  ET tube placement  EXAM: PORTABLE CHEST - 1 VIEW  COMPARISON:  01/19/2015  FINDINGS: There is a right IJ catheter with tip in the projection of the cavoatrial junction. ET tube tip is above the carina. There is a nasogastric tube low with tip below the field of view. Normal heart size. Large left pleural effusion is unchanged from previous exam. Right lung is relatively clear.  IMPRESSION: 1. Satisfactory position of ET tube with tip above  carina. 2. Large left effusion as before.   Electronically Signed   By: Signa Kell M.D.   On: 01/19/2015 13:47   Dg Chest Port 1 View  01/19/2015   CLINICAL DATA:  Pneumonia, diabetes, essential hypertension  EXAM: PORTABLE CHEST - 1 VIEW  COMPARISON:  Portable exam 0631 hr compared to 2015-02-10  FINDINGS: RIGHT jugular central venous catheter with tip projecting over SVC.  Nasogastric tube extends into stomach.  Tip of endotracheal tube projects 7.5 cm above carina.  Enlargement of cardiac silhouette.  Mediastinal contours normal.  Increased LEFT pleural effusion and LEFT lung atelectasis.  RIGHT lung clear.  No pneumothorax.  IMPRESSION: Moderate LEFT pleural effusion increased since previous exam with increased LEFT lung atelectasis.  Enlargement of cardiac silhouette.   Electronically Signed   By: Ulyses Southward M.D.   On: 01/19/2015 07:39   Dg Abd Portable 1v  01/19/2015   CLINICAL DATA:  Abdominal pain, history diabetes, hypertension  EXAM: PORTABLE ABDOMEN - 1 VIEW  COMPARISON:  Portable exam 1021 hr compared to prior CT of 04/29/2005  FINDINGS: Tip of nasogastric tube projects over stomach.  Minimally prominent stool throughout colon.  Few scattered atherosclerotic calcifications and pelvic phleboliths.  No bowel dilatation or bowel wall thickening.  Rotated to the LEFT.  Multilevel degenerative disc disease changes lumbar spine.  Mild degenerative changes of the RIGHT hip joint.  IMPRESSION: Slightly increased stool throughout colon.   Electronically Signed   By: Ulyses Southward M.D.   On: 01/19/2015 10:53    ASSESSMENT / PLAN:  PULMONARY OETT 2/19 A: acute hypoxic respiratory Failure  Suspected L CAP (although no leukocytosis) L pleural effusion, ? Due to PNA vs myxedema P:   MV support until MS and resp muscle strength improve, likely next 24-48h Follow CXR Diuresis if he can tolerate from BP and renal perspectives, none ordered currently Will assess L pleural space for possible bedside  thora on 2/22  CARDIOVASCULAR CVL right IJ 2/19>>> A:  Shock, presumed Septic shock + hypothyroidism and bradycardia Bradycardia - myxedema coma Presumed adrenal insuff Mild troponin bump--> likely demand ischemia  mod PA htn Global reduction EF - myxedema likely cause P: Stress dose hydrocort for myxedema associated AI; synthroid (T4) 100 IV qd for now. Will need to consider giving concomitant T3. There is argument in the lit about the risks / benefits if this. Cardiac risk would be a reason to avoid T3 Norepi  for MAP >60, wean as able Dopamine at 5 until HR staying > 60, then wean No indication pacer at this time. He is likely a poor candidate for  same  RENAL A:   AKI (acute on chronic renal failure w/ BL sCr 1.5 to 1.6 range) - ATN sepsis Mild hematuria  H/o BPH Metabolic acidosis  Mild hypomagnesemia P:   Follow BMP  GASTROINTESTINAL A:   Weight loss Protein calorie malnutrition ascites P:   Tolerating tube feeds ppi  HEMATOLOGIC A:   Anemia of chronic disease w/out s/sx of bleeding  P:  [place] heparin  Trend cbc   INFECTIOUS A:   Apparent Left CAP Septic shock  P:  BCx2 2/19>>> UC 2/19>>> Sputum 2/19>>> RVP 2/19>>>  vanc 2/19>>>2/21 Zosyn 2/19>>>2/19 Cefepime 2/19>>> azithro 2/19>>>2/21  Remains culture neg Will assess for L thoracentesis Follow on cefepime for now.   ENDOCRINE A:   Hypothyroidism with mxyedma coma H/o DM  P:   Continue IV 100 mcg synthroid NO T3, at this age with PAH and other associated valvular dz - high risk VT , death  NEUROLOGIC A:   Acute Encephalopathy  H/o memory loss P:   RASS goal: -2 PAD protocol  Supportive care  D/c clonazepam on 2/21  FAMILY  - Updates: none noted - Inter-disciplinary family meet or Palliative Care meeting due by:  2/26   Independent CC time 35 minutes  Levy Pupa, MD, PhD 01/20/2015, 11:01 AM [facility] [phone] or if no answer [phone]

## 2015-01-20 NOTE — Progress Notes (Signed)
Progress Note  Subjective:    Intubated. Responds to voice. CVP 15-16.   Objective:   Temp:  [96.8 F (36 C)-99.9 F (37.7 C)] 99 F (37.2 C) (02/22 0600) Pulse Rate:  [47-95] 56 (02/22 0600) Resp:  [11-16] 12 (02/22 0600) BP: (99-156)/(54-85) 125/60 mmHg (02/22 0600) SpO2:  [96 %-100 %] 96 % (02/22 0600) FiO2 (%):  [40 %] 40 % (02/22 0415) Weight:  [173 lb 8 oz (78.7 kg)] 173 lb 8 oz (78.7 kg) (02/22 0456) Last BM Date: 01/16/15  Filed Weights   01/18/15 0500 01/19/15 0400 01/20/15 0456  Weight: 167 lb 8.8 oz (76 kg) 173 lb 8 oz (78.7 kg) 173 lb 8 oz (78.7 kg)    Intake/Output Summary (Last 24 hours) at 01/20/15 0726 Last data filed at 01/20/15 0600  Gross per 24 hour  Intake 3370.72 ml  Output    660 ml  Net 2710.72 ml    Telemetry: NSR, sinus bradycardia, PAC's  Physical Exam: General: Intubated, sedated. Opens eyes to verbal stimuli.  HEENT: PERRL. Moist mucus membranes. OETT/OGT in place.  Neck: Supple, no lymphadenopathy or carotid bruits. Right CVL Lungs: Coarse breath sounds bilaterally. Decreased on left.  Heart: Bradycardic, no murmurs, gallops, or rubs Abdomen: Soft, non-tender, non-distended, BS + Extremities: No cyanosis or clubbing. +2 pitting edema.  Neurologic: Intubated, sedated. RASS-1   Lab Results:  Basic Metabolic Panel:  Recent Labs Lab 01/16/2015 1315 01/18/15 0330 01/19/15 0500 01/20/15 0435  NA 133* 137 133* 134*  K 4.4 4.6 4.3 4.1  CL 102 107 106 105  CO2 GLUCOSE 133* 83 129* 141*  BUN 56* 60* 67* 78*  CREATININE 2.32* 2.43* 2.52* 2.58*  CALCIUM 8.3* 8.4 8.2* 8.3*  MG 1.9 1.7  --   --     Liver Function Tests:  Recent Labs Lab 01/23/2015 1134 01/03/2015 1315 01/20/15 0435  AST 148* 170* 70*  ALT 79* 90* 57*  ALKPHOS 141* 148* 115  BILITOT 0.9 1.2 0.8  PROT 5.2* 5.1* 5.2*  ALBUMIN 2.8* 2.7* 2.5*    CBC:  Recent Labs Lab 01/18/15 0330 01/19/15 0500 01/20/15 0435  WBC 7.0 6.2 7.1  HGB 7.5*  7.6* 8.3*  HCT 23.2* 23.1* 25.3*  MCV 74.6* 75.0* 75.3*  PLT 130* 92* PENDING    Cardiac Enzymes:  Recent Labs Lab 01/23/2015 1315 01/15/2015 1918 01/18/15 0234  TROPONINI 0.08* 0.08* 0.10*    Coagulation:  Recent Labs Lab 01/10/2015 1315  INR 1.24    Radiology: US Renal  01/18/2015   CLINICAL DATA:  Initial evaluation for renal failure and mild hematuria  EXAM: RENAL/URINARY TRACT ULTRASOUND COMPLETE  COMPARISON:  None.  FINDINGS: Right Kidney:  Length: 10.6 cm. Significantly echogenic kidney. 2.3 cm simple cyst lower pole.  Left Kidney:  Length: 9.2 cm. Left kidney is not well evaluated. It appears significantly echogenic in is difficult to characterize further.  Bladder:  Decompressed by Foley catheter and therefore not evaluated  There is a moderate volume of ascites.  IMPRESSION: Ascites.  Severe renal echogenicity bilaterally consistent with medical renal disease.   Electronically Signed   By: Esperanza Heir M.D.   On: 01/18/2015 21:53   Dg Chest Port 1 View  01/19/2015   CLINICAL DATA:  ET tube placement  EXAM: PORTABLE CHEST - 1 VIEW  COMPARISON:  01/19/2015  FINDINGS: There is a right IJ catheter with tip in the projection of the cavoatrial junction. ET tube tip is above the  carina. There is a nasogastric tube low with tip below the field of view. Normal heart size. Large left pleural effusion is unchanged from previous exam. Right lung is relatively clear.  IMPRESSION: 1. Satisfactory position of ET tube with tip above carina. 2. Large left effusion as before.   Electronically Signed   By: Signa Kell M.D.   On: 01/19/2015 13:47   Dg Chest Port 1 View  01/19/2015   CLINICAL DATA:  Pneumonia, diabetes, essential hypertension  EXAM: PORTABLE CHEST - 1 VIEW  COMPARISON:  Portable exam 0631 hr compared to 01/23/2015  FINDINGS: RIGHT jugular central venous catheter with tip projecting over SVC.  Nasogastric tube extends into stomach.  Tip of endotracheal tube projects 7.5 cm above  carina.  Enlargement of cardiac silhouette.  Mediastinal contours normal.  Increased LEFT pleural effusion and LEFT lung atelectasis.  RIGHT lung clear.  No pneumothorax.  IMPRESSION: Moderate LEFT pleural effusion increased since previous exam with increased LEFT lung atelectasis.  Enlargement of cardiac silhouette.   Electronically Signed   By: Ulyses Southward M.D.   On: 01/19/2015 07:39   Dg Abd Portable 1v  01/19/2015   CLINICAL DATA:  Abdominal pain, history diabetes, hypertension  EXAM: PORTABLE ABDOMEN - 1 VIEW  COMPARISON:  Portable exam 1021 hr compared to prior CT of 04/29/2005  FINDINGS: Tip of nasogastric tube projects over stomach.  Minimally prominent stool throughout colon.  Few scattered atherosclerotic calcifications and pelvic phleboliths.  No bowel dilatation or bowel wall thickening.  Rotated to the LEFT.  Multilevel degenerative disc disease changes lumbar spine.  Mild degenerative changes of the RIGHT hip joint.  IMPRESSION: Slightly increased stool throughout colon.   Electronically Signed   By: Ulyses Southward M.D.   On: 01/19/2015 10:53      Medications:   Scheduled Medications: . antiseptic oral rinse  7 mL Mouth Rinse QID  . ceFEPime (MAXIPIME) IV  1 g Intravenous Q24H  . chlorhexidine  15 mL Mouth Rinse BID  . clonazePAM  0.5 mg Oral BID  . feeding supplement (VITAL HIGH PROTEIN)  1,000 mL Per Tube Q24H  . heparin subcutaneous  5,000 Units Subcutaneous 3 times per day  . hydrocortisone sodium succinate  50 mg Intravenous Q6H  . insulin aspart  0-9 Units Subcutaneous 6 times per day  . levothyroxine  100 mcg Intravenous Daily  . pantoprazole (PROTONIX) IV  40 mg Intravenous Q24H  . sodium chloride  10-40 mL Intracatheter Q12H     Infusions: . sodium chloride 50 mL/hr at 01/19/15 1512  . DOPamine 5 mcg/kg/min (01/19/15 2100)  . fentaNYL infusion INTRAVENOUS 200 mcg/hr (01/19/15 2100)  . norepinephrine (LEVOPHED) Adult infusion 2 mcg/min (01/19/15 2200)     PRN  Medications:  etomidate, fentaNYL, sodium chloride, succinylcholine   Assessment and Plan:  81 y/o M w/ PMHx of DM Type II, HTN, CKD, hypothyroidism, chronic systolic CHF, and dementia, admitted on 01/23/2015 w/ altered mental status and acute respiratory failure. Found to have myxedema coma w/ TSH of 78.4 on admission as well as questionable pnuemonia. Cardiology consulted for bradycardia.   Bradycardia: Initially patient had atrial fibrillation w/ slow ventricular response, now back in sinus rhythm, rate in the high 50's on telemetry. Most likely related to severe thyroid dysfunction. Troponins mildly elevated on admission. -Wean pressors as tolerated -Treat hypothyroid per PCCM; Synthroid IV + steroids -No TVP needed at this time.   VDRF: Intubated yesterday, most likely related to myxedema, acute encephalopathy.  -Per PCCM  Chronic Combined CHF: ECHO from 01/18/15 showed EF 45-50% w/ severe concentric hypertrophy and grade 2 diastolic dysfunction. Worsening left sided effusion on CXR this AM. CVP elevated (15) -Discontinue IVF -LAsix 40 mg IV once -Pressors per PCCM; Dopamine, Levophed  Pneumonia: CXR this AM shows worsened left-sided effusion/infiltrate. ? parapneumonic -Cefepime; per PCCM -Conisder thoracentesis  CKD: Baseline Cr 1.5-1.7, Cr trend during this admission as follows:   Recent Labs Lab 01/04/2015 1229 01/08/2015 1315 01/18/15 0330 01/19/15 0500 01/20/15 0435  CREATININE 2.30* 2.32* 2.43* 2.52* 2.58*  -Per PCCM   Lauris Chroman, MD PGY-2 Internal Medicine Pager: [phone]  Patient seen and examined with Dr. Yetta Barre. We discussed all aspects of the encounter. I agree with the assessment and plan as stated above.   HR improved this am on pressors. Can wean pressors as tolerated. He is volume overloaded. Stop IVF give one dose of lasix now. Large left pleural effusion on CXR. ? Parapneumonic. Will likely need thoracentesis.   Cardiology will sign off. Please call if we  can be of further assistance.   Shamiah Kahler,MD 7:59 AM

## 2015-01-20 NOTE — Procedures (Signed)
Thoracentesis Procedure Note  Pre-operative Diagnosis: pleural effusion   Post-operative Diagnosis: same  Indications: evaluation and therapy   Procedure Details  Consent: Informed consent was obtained. Risks of the procedure were discussed including: infection, bleeding, pain, pneumothorax.  Under sterile conditions the patient was positioned. Betadine solution and sterile drapes were utilized.  1% buffered lidocaine was used to anesthetize the pleural space which was identified via real time [place].  Fluid was obtained without any difficulties and minimal blood loss.  A dressing was applied to the wound and wound care instructions were provided.   Findings 1100 ml of clear pleural fluid was obtained. A sample was sent to Pathology  and cell counts, as well as for infection analysis.  Complications: air noted at End of procedure.          Condition: stable  Plan A follow up chest x-ray was ordered. Bed Rest for 0 hours. Tylenol 650 mg. for pain.  Levy Pupa, MD, PhD 01/20/2015, 1:01 PM [facility] [phone] or if no answer [phone]

## 2015-01-20 NOTE — Procedures (Signed)
Chest Tube Insertion Procedure Note  Indications:  Clinically significant Pneumothorax  Pre-operative Diagnosis: Pneumothorax  Post-operative Diagnosis: Pneumothorax  Procedure Details  Informed consent was obtained for the procedure, including sedation.  Risks of lung perforation, hemorrhage, arrhythmia, and adverse drug reaction were discussed.   After sterile skin prep, using standard technique, a 14 French tube was placed in the left lateral  rib space.  Findings: Transient 1/7 air leak   Estimated Blood Loss:  Minimal         Specimens:  None              Complications:  None; patient tolerated the procedure well.         Disposition: ICU - intubated and hemodynamically stable.         Condition: stable  Levy Pupa, MD, PhD 01/20/2015, 1:01 PM [facility] [phone] or if no answer [phone]

## 2015-01-21 ENCOUNTER — Inpatient Hospital Stay (HOSPITAL_COMMUNITY): Payer: Medicare Other

## 2015-01-21 LAB — GLUCOSE, CAPILLARY
GLUCOSE-CAPILLARY: 147 mg/dL — AB (ref 70–99)
GLUCOSE-CAPILLARY: 176 mg/dL — AB (ref 70–99)
GLUCOSE-CAPILLARY: 157 mg/dL — AB (ref 70–99)
GLUCOSE-CAPILLARY: 129 mg/dL — AB (ref 70–99)
GLUCOSE-CAPILLARY: 128 mg/dL — AB (ref 70–99)
Glucose-Capillary: 105 mg/dL — ABNORMAL HIGH (ref 70–99)

## 2015-01-21 LAB — RESPIRATORY VIRUS PANEL
ADENOVIRUS: NEGATIVE
Influenza A: POSITIVE — AB
Influenza B: NEGATIVE
METAPNEUMOVIRUS: NEGATIVE
Parainfluenza 1: NEGATIVE
Parainfluenza 2: NEGATIVE
Parainfluenza 3: NEGATIVE
RESPIRATORY SYNCYTIAL VIRUS A: NEGATIVE
Respiratory Syncytial Virus B: NEGATIVE
Rhinovirus: NEGATIVE

## 2015-01-21 LAB — CHOLESTEROL, TOTAL: Cholesterol: 118 mg/dL (ref 0–200)

## 2015-01-21 LAB — PROTEIN, TOTAL: Total Protein: 5.1 g/dL — ABNORMAL LOW (ref 6.0–8.3)

## 2015-01-21 LAB — LACTATE DEHYDROGENASE: LDH: 312 U/L — ABNORMAL HIGH (ref 94–250)

## 2015-01-21 MED ORDER — FENTANYL BOLUS VIA INFUSION
25.0000 ug | INTRAVENOUS | Status: DC | PRN
  Administered 2015-01-23 (×2): 50 ug via INTRAVENOUS
  Filled 2015-01-21: qty 50

## 2015-01-21 MED ORDER — MIDAZOLAM HCL 2 MG/2ML IJ SOLN: 0.5000 mg | INTRAMUSCULAR | Status: DC | PRN

## 2015-01-21 MED ORDER — CETYLPYRIDINIUM CHLORIDE 0.05 % MT LIQD
7.0000 mL | OROMUCOSAL | Status: DC
  Administered 2015-01-21 – 2015-01-24 (×28): 7 mL via OROMUCOSAL

## 2015-01-21 MED ORDER — POLYETHYLENE GLYCOL 3350 17 G PO PACK
17.0000 g | PACK | Freq: Every day | ORAL | Status: DC | PRN
  Administered 2015-01-21: 17 g via ORAL
  Filled 2015-01-21: qty 1

## 2015-01-21 MED ORDER — VITAL HIGH PROTEIN PO LIQD
1000.0000 mL | ORAL | Status: DC
Start: 2015-01-22 — End: 2015-01-22
  Administered 2015-01-22: 1000 mL
  Filled 2015-01-21 (×4): qty 1000

## 2015-01-21 NOTE — Progress Notes (Signed)
I saw Mr. Macvicar in the ICU today. No family was present. He was still intubated and sedated. Per nursing, he was more responsive than yesterday, but would not respond to commands while I was in the room. CCM considering weaning vent tomorrow. Will continue to follow socially until transferred out to floor, at which point we will resume care. Appreciate the ICU team for managing care.   Filomena Jungling, Med Student [facility]  Upper Level Addendum:  I have seen and evaluated this patient along with Daniella DiNizo [facility]) and reviewed the above note, making necessary revisions  Saralyn Pilar, DO [facility], PGY-2

## 2015-01-21 NOTE — Progress Notes (Signed)
PULMONARY / CRITICAL CARE MEDICINE   Name: Timothy Mayo MRN: [id number] DOB: [birth year]    ADMISSION DATE:  01/14/2015 CONSULTATION DATE:  2/19  REFERRING MD :  Gilmore Laroche  CHIEF COMPLAINT:  Acute respiratory failure septic shock   INITIAL PRESENTATION:  81 year old male admitted from home on 2/19 after being found w/ altered mental status and increased labored breathing. Admitted to ICU w/ working dx of acute respiratory failure in setting of L CAP w/ resultant septic shock/MODS. W/u revealed severe hypothyroidism and apparent myxedema coma  STUDIES:  CT head 2/19: NAD Echo 02/05/23 45-50% global, pa pressure 65 mod pa htn  SIGNIFICANT EVENTS: 2/19: discussed prognosis w/ family. Made DNR in the event of cardiac arrest but otherwise aggressive care.  05-Feb-2023- TSH over 80, IV synthroid , steroids started 2023/02/05 renal US- ascites, med renal dz  SUBJECTIVE:  More awake, tolerating d/c dopamine from BP standpoint but HR to 50's   VITAL SIGNS: Temp:  [95.7 F (35.4 C)-98.1 F (36.7 C)] 95.7 F (35.4 C) (02/23 0900) Pulse Rate:  [56-70] 66 (02/23 0800) Resp:  [7-20] 12 (02/23 0900) BP: (64-143)/(39-79) 64/39 mmHg (02/23 0900) SpO2:  [100 %] 100 % (02/23 0800) FiO2 (%):  [40 %] 40 % (02/23 0810) Weight:  [79.1 kg (174 lb 6.1 oz)] 79.1 kg (174 lb 6.1 oz) (02/23 0452) HEMODYNAMICS: CVP:  [5 mmHg-12 mmHg] 5 mmHg VENTILATOR SETTINGS: Vent Mode:  [-] PRVC FiO2 (%):  [40 %] 40 % Set Rate:  [12 bmp] 12 bmp Vt Set:  [600 mL] 600 mL PEEP:  [5 cmH20] 5 cmH20 Plateau Pressure:  [27 cmH20-32 cmH20] 30 cmH20 INTAKE / OUTPUT:  Intake/Output Summary (Last 24 hours) at 01/21/15 0941 Last data filed at 01/21/15 0910  Gross per 24 hour  Intake 2164.81 ml  Output   2230 ml  Net -65.19 ml    PHYSICAL EXAMINATION: General:  sedated Neuro:  rass 0, not following commands HEENT:  MM dry, no JVD NCAT Cardiovascular:  rrr s1 s 2 Lungs:  ronchi throughout, unchanged Abdomen:  NT + bowel sounds,  wnl  Musculoskeletal:  Intact  Skin:  3 plus edema, pitting   LABS:  CBC  Recent Labs Lab 05-Feb-2015 0330 01/19/15 0500 01/20/15 0435  WBC 7.0 6.2 7.1  HGB 7.5* 7.6* 8.3*  HCT 23.2* 23.1* 25.3*  PLT 130* 92* 105*   Coag's  Recent Labs Lab 01/16/2015 1315  APTT 30  INR 1.24   BMET  Recent Labs Lab 2015/02/05 0330 01/19/15 0500 01/20/15 0435  NA 137 133* 134*  K 4.6 4.3 4.1  CL 107 106 105  CO2 BUN 60* 67* 78*  CREATININE 2.43* 2.52* 2.58*  GLUCOSE 83 129* 141*   Electrolytes  Recent Labs Lab 01/05/2015 1315 02/05/2015 0330 01/19/15 0500 01/20/15 0435  CALCIUM 8.3* 8.4 8.2* 8.3*  MG 1.9 1.7  --   --   PHOS 3.2 3.1  --   --    Sepsis Markers  Recent Labs Lab 01/13/2015 1228 01/05/2015 1316 02-05-2015 0945 01/19/15 0500 01/20/15 0435  LATICACIDVEN 2.17* 1.8  --   --   --   PROCALCITON  --   --  1.34 2.92 2.47   ABG  Recent Labs Lab 01/09/2015 1439  PHART 7.488*  PCO2ART 28.6*  PO2ART 528.0*   Liver Enzymes  Recent Labs Lab 01/14/2015 1134 01/11/2015 1315 01/20/15 0435  AST 148* 170* 70*  ALT 79* 90* 57*  ALKPHOS 141* 148* 115  BILITOT 0.9 1.2 0.8  ALBUMIN 2.8* 2.7* 2.5*   Cardiac Enzymes  Recent Labs Lab 01/04/2015 1315 01/05/2015 1918 01/18/15 0234  TROPONINI 0.08* 0.08* 0.10*   Glucose  Recent Labs Lab 01/20/15 1135 01/20/15 1533 01/20/15 1955 01/20/15 2322 01/21/15 0446 01/21/15 0738  GLUCAP 158* 144* 154* 105* 147* 176*    Imaging Dg Chest Port 1 View  01/20/2015   CLINICAL DATA:  Left-sided pneumothorax.  EXAM: PORTABLE CHEST - 1 VIEW  COMPARISON:  Same day.  FINDINGS: Stable cardiomediastinal silhouette. Endotracheal tube is in grossly good position with distal tip 6 cm above the carina. Right internal jugular catheter line is noted with distal tip overlying expected position the SVC. Right lung is clear. There has been interval placement of left-sided chest tube. No pneumothorax is noted at this time. Curving line seen  projected over right hemothorax is due to overlying soft tissue.  IMPRESSION: No pneumothorax seen status post left-sided chest tube placement.   Electronically Signed   By: Lupita Raider, M.D.   On: 01/20/2015 14:12   Dg Chest Port 1 View  01/20/2015   CLINICAL DATA:  Status post left-sided thoracentesis.  EXAM: PORTABLE CHEST - 1 VIEW  COMPARISON:  Same day.  FINDINGS: Stable cardiomediastinal silhouette. Right internal jugular catheter is noted with distal tip overlying expected position of the SVC. Right lung is clear. Large left pleural effusion noted on prior exam has resolved status post thoracentesis. Mild left-sided pneumothorax is noted most likely due to incomplete re-expansion of the lung due to previous pleural effusion. No mediastinal shift is noted. Bony thorax is intact.  IMPRESSION: Large left pleural effusion noted on prior exam is resolved status post thoracentesis. Mild left-sided pneumothorax is noted most likely due to incomplete re-expansion due to previous pleural effusion. Continued radiographic follow-up is recommended.   Electronically Signed   By: Lupita Raider, M.D.   On: 01/20/2015 13:18   Dg Chest Port 1 View  01/20/2015   CLINICAL DATA:  81 year old diabetic male with pneumonia and shortness breath. Subsequent encounter.  EXAM: PORTABLE CHEST - 1 VIEW  COMPARISON:  01/19/2015  FINDINGS: Endotracheal tube is in place and tip is poorly delineated on the present exam appearing to be approximately 4.1 cm above the carina.  Right central line tip proximal to mid superior vena cava.  Right lung apex not included on present exam. No gross pneumothorax detected.  Rotation to left.  Consolidation left lung may represent pleural fluid with underlying atelectasis or infiltrate. This limits evaluation for possibility of underlying mass. Follow-up until clearance recommended. If this is not clear in the appropriate time frame then CT imaging may be considered.  Limited evaluation of  mediastinal structures.  IMPRESSION: Persistent consolidation left lung may represent pleural fluid with underlying atelectasis or infiltrate. Followup until clearance recommended as noted above.   Electronically Signed   By: Lacy Duverney M.D.   On: 01/20/2015 07:45    ASSESSMENT / PLAN:  PULMONARY OETT 2/19 A: acute hypoxic respiratory Failure  Initially suspected L CAP, now less likely L pleural effusion, more likely due to myxedema than PNA, no infiltrate on post-thora CXR Iatrogenic L PTX P:   MV support until MS and resp muscle strength improve, likely next 24-48h Follow CXR Diuresis if he can tolerate from BP and renal perspectives, none ordered currently Change CT to waterseal on 2/24, leave on suction today, follow CXR  CARDIOVASCULAR CVL right IJ 2/19>>> A:  Shock, presumed Septic shock +  hypothyroidism and bradycardia Bradycardia - myxedema coma Presumed adrenal insuff Mild troponin bump--> likely demand ischemia  mod PA htn Global reduction EF - myxedema likely cause P: Stress dose hydrocort for myxedema associated AI; synthroid (T4) 100 IV qd for now. Will need to consider giving concomitant T3. There is argument in the lit about the risks / benefits if this. I feel cardiac risk precludes T3 at this time Norepi  for MAP >60, wean as able Dopamine at 5 until HR staying > 60, then wean No indication pacer at this time. He is likely a poor candidate for same  RENAL A:   AKI (acute on chronic renal failure w/ BL sCr 1.5 to 1.6 range) - ATN sepsis Mild hematuria  H/o BPH Metabolic acidosis, improved Mild hypomagnesemia P:   Follow BMP, not currently on diuretics  GASTROINTESTINAL A:   Weight loss Protein calorie malnutrition ascites P:   Tolerating tube feeds ppi  HEMATOLOGIC A:   Anemia of chronic disease w/out s/sx of bleeding  P:  Williams heparin  Trend cbc   INFECTIOUS A:   Apparent Left CAP Septic shock  P:  BCx2 2/19>>> UC 2/19>>> Sputum  2/19>>> RVP 2/19>>>  vanc 2/19>>>2/21 Zosyn 2/19>>>2/19 Cefepime 2/19>>> 2/23 azithro 2/19>>>2/21  Remains culture neg D/c cefepime 2/23 in absence of true LLL infiltrate (post-thoracentesis)  ENDOCRINE A:   Hypothyroidism with mxyedma coma H/o DM  P:   Stress dose hydrocort Continue IV 100 mcg synthroid No T3, at this age with PAH and other associated valvular dz - high risk VT or cardiac decompensation  NEUROLOGIC A:   Acute Encephalopathy  H/o memory loss P:   RASS goal: -1 to 0 PAD protocol  Supportive care  D/c clonazepam on 2/21  FAMILY  - Updates: no family present 2/23 - Inter-disciplinary family meet or Palliative Care meeting due by:  2/26   Independent CC time 35 minutes  Levy Pupa, MD, PhD 01/21/2015, 9:41 AM [facility] [phone] or if no answer [phone]

## 2015-01-22 ENCOUNTER — Inpatient Hospital Stay (HOSPITAL_COMMUNITY): Payer: Medicare Other

## 2015-01-22 LAB — BASIC METABOLIC PANEL
Anion gap: 8 (ref 5–15)
BUN: 106 mg/dL — AB (ref 6–23)
CALCIUM: 8 mg/dL — AB (ref 8.4–10.5)
CO2: 21 mmol/L (ref 19–32)
CREATININE: 2.65 mg/dL — AB (ref 0.50–1.35)
Chloride: 105 mmol/L (ref 96–112)
GFR, EST AFRICAN AMERICAN: 23 mL/min — AB (ref >90)
GFR, EST NON AFRICAN AMERICAN: 20 mL/min — AB (ref >90)
GLUCOSE: 157 mg/dL — AB (ref 70–99)
POTASSIUM: 3.7 mmol/L (ref 3.5–5.1)
Sodium: 134 mmol/L — ABNORMAL LOW (ref 135–145)

## 2015-01-22 LAB — CBC
HCT: 23.2 % — ABNORMAL LOW (ref 39.0–52.0)
HCT: 24.7 % — ABNORMAL LOW (ref 39.0–52.0)
HEMOGLOBIN: 7.5 g/dL — AB (ref 13.0–17.0)
HEMOGLOBIN: 8.2 g/dL — AB (ref 13.0–17.0)
MCH: 24.5 pg — ABNORMAL LOW (ref 26.0–34.0)
MCH: 25.3 pg — ABNORMAL LOW (ref 26.0–34.0)
MCHC: 32.3 g/dL (ref 30.0–36.0)
MCHC: 33.2 g/dL (ref 30.0–36.0)
MCV: 75.8 fL — AB (ref 78.0–100.0)
MCV: 76.2 fL — AB (ref 78.0–100.0)
PLATELETS: 79 10*3/uL — AB (ref 150–400)
Platelets: 137 10*3/uL — ABNORMAL LOW (ref 150–400)
RBC: 3.06 MIL/uL — AB (ref 4.22–5.81)
RBC: 3.24 MIL/uL — ABNORMAL LOW (ref 4.22–5.81)
RDW: 22.7 % — ABNORMAL HIGH (ref 11.5–15.5)
RDW: 22.7 % — ABNORMAL HIGH (ref 11.5–15.5)
WBC: 7.6 10*3/uL (ref 4.0–10.5)
WBC: 7.9 10*3/uL (ref 4.0–10.5)

## 2015-01-22 LAB — GLUCOSE, CAPILLARY
GLUCOSE-CAPILLARY: 165 mg/dL — AB (ref 70–99)
GLUCOSE-CAPILLARY: 136 mg/dL — AB (ref 70–99)
GLUCOSE-CAPILLARY: 112 mg/dL — AB (ref 70–99)
GLUCOSE-CAPILLARY: 135 mg/dL — AB (ref 70–99)
GLUCOSE-CAPILLARY: 156 mg/dL — AB (ref 70–99)
Glucose-Capillary: 126 mg/dL — ABNORMAL HIGH (ref 70–99)

## 2015-01-22 MED ORDER — ADULT MULTIVITAMIN W/MINERALS CH
1.0000 | ORAL_TABLET | Freq: Every day | ORAL | Status: DC
  Administered 2015-01-22 – 2015-01-24 (×3): 1
  Filled 2015-01-22 (×4): qty 1

## 2015-01-22 MED ORDER — PRO-STAT SUGAR FREE PO LIQD
30.0000 mL | Freq: Three times a day (TID) | ORAL | Status: DC
  Administered 2015-01-22 – 2015-01-24 (×6): 30 mL
  Filled 2015-01-22 (×11): qty 30

## 2015-01-22 MED ORDER — VITAL HIGH PROTEIN PO LIQD
1000.0000 mL | ORAL | Status: DC
  Administered 2015-01-22 – 2015-01-24 (×3): 1000 mL
  Filled 2015-01-22 (×4): qty 1000

## 2015-01-22 NOTE — Progress Notes (Signed)
PULMONARY / CRITICAL CARE MEDICINE   Name: Timothy Mayo MRN: [id number] DOB: 06-23-1925    ADMISSION DATE:  01/30/15 CONSULTATION DATE:  2/19  REFERRING MD :  Gilmore Laroche  CHIEF COMPLAINT:  Acute respiratory failure septic shock   INITIAL PRESENTATION:  81 year old male admitted from home on 2/19 after being found w/ altered mental status and increased labored breathing. Admitted to ICU w/ working dx of acute respiratory failure in setting of L CAP w/ resultant septic shock/MODS. W/u revealed severe hypothyroidism and apparent myxedema coma  STUDIES:  CT head 2/19: NAD Echo 2023/01/31 45-50% global, pa pressure 65 mod pa htn  SIGNIFICANT EVENTS: 2/19: discussed prognosis w/ family. Made DNR in the event of cardiac arrest but otherwise aggressive care.  01-31-23- TSH over 80, IV synthroid , steroids started January 31, 2023 renal US- ascites, med renal dz  SUBJECTIVE:  Poor urine return from foley, has had clots, inability to get return from flush  VITAL SIGNS: Temp:  [94.1 F (34.5 C)-98.1 F (36.7 C)] 97 F (36.1 C) (02/24 1200) Pulse Rate:  [35-75] 35 (02/24 1100) Resp:  [11-22] 16 (02/24 1200) BP: (90-126)/(44-69) 105/44 mmHg (02/24 1200) SpO2:  [90 %-100 %] 94 % (02/24 1100) FiO2 (%):  [40 %] 40 % (02/24 1100) Weight:  [78.5 kg (173 lb 1 oz)] 78.5 kg (173 lb 1 oz) (02/24 0311) HEMODYNAMICS: CVP:  [3 mmHg-6 mmHg] 5 mmHg VENTILATOR SETTINGS: Vent Mode:  [-] PRVC FiO2 (%):  [40 %] 40 % Set Rate:  [12 bmp] 12 bmp Vt Set:  [600 mL] 600 mL PEEP:  [5 cmH20] 5 cmH20 Plateau Pressure:  [16 cmH20-30 cmH20] 16 cmH20 INTAKE / OUTPUT:  Intake/Output Summary (Last 24 hours) at 01/22/15 1331 Last data filed at 01/22/15 1300  Gross per 24 hour  Intake 1984.9 ml  Output   1150 ml  Net  834.9 ml    PHYSICAL EXAMINATION: General:  sedated Neuro:  rass 0, not following commands HEENT:  MM dry, no JVD NCAT Cardiovascular:  rrr s1 s 2 Lungs:  ronchi throughout, unchanged Abdomen:  NT + bowel  sounds, wnl  GU: severe penile and scrotal edema, foley in place but meatus difficulty to find Musculoskeletal:  Intact  Skin:  3 plus edema, pitting   LABS:  CBC  Recent Labs Lab 01/19/15 0500 01/20/15 0435 01/22/15 0515  WBC 6.2 7.1 7.6  HGB 7.6* 8.3* 7.5*  HCT 23.1* 25.3* 23.2*  PLT 92* 105* 79*   Coag's  Recent Labs Lab 30-Jan-2015 1315  APTT 30  INR 1.24   BMET  Recent Labs Lab 01/19/15 0500 01/20/15 0435 01/22/15 0515  NA 133* 134* 134*  K 4.3 4.1 3.7  CL 106 105 105  CO2 BUN 67* 78* 106*  CREATININE 2.52* 2.58* 2.65*  GLUCOSE 129* 141* 157*   Electrolytes  Recent Labs Lab Jan 30, 2015 1315 2015-01-31 0330 01/19/15 0500 01/20/15 0435 01/22/15 0515  CALCIUM 8.3* 8.4 8.2* 8.3* 8.0*  MG 1.9 1.7  --   --   --   PHOS 3.2 3.1  --   --   --    Sepsis Markers  Recent Labs Lab 01-30-15 1228 30-Jan-2015 1316 2015/01/31 0945 01/19/15 0500 01/20/15 0435  LATICACIDVEN 2.17* 1.8  --   --   --   PROCALCITON  --   --  1.34 2.92 2.47   ABG  Recent Labs Lab 01-30-15 1439  PHART 7.488*  PCO2ART 28.6*  PO2ART 528.0*   Liver Enzymes  Recent Labs Lab 01/24/15 1134 2015-01-24 1315 01/20/15 0435  AST 148* 170* 70*  ALT 79* 90* 57*  ALKPHOS 141* 148* 115  BILITOT 0.9 1.2 0.8  ALBUMIN 2.8* 2.7* 2.5*   Cardiac Enzymes  Recent Labs Lab 01/24/2015 1315 January 24, 2015 1918 01/18/15 0234  TROPONINI 0.08* 0.08* 0.10*   Glucose  Recent Labs Lab 01/21/15 1558 01/21/15 1944 01/22/15 0015 01/22/15 0411 01/22/15 0910 01/22/15 1212  GLUCAP 129* 128* 126* 165* 136* 112*    Imaging Dg Chest Port 1 View  01/21/2015   CLINICAL DATA:  Chest tube in place.  EXAM: PORTABLE CHEST - 1 VIEW  COMPARISON:  Single view of the chest 01/20/2015.  FINDINGS: Support tubes and lines including a pigtail catheter in the left chest are unchanged. No pneumothorax is identified. Basilar atelectasis on the left is noted. The right lung is expanded and clear. Heart size is  normal.  IMPRESSION: Negative for pneumothorax with a left chest tube in place.  Left basilar atelectasis without marked change.   Electronically Signed   By: Drusilla Kanner M.D.   On: 01/21/2015 11:47    ASSESSMENT / PLAN:  PULMONARY OETT 2/19 A: acute hypoxic respiratory Failure  Initially suspected L CAP, now less likely L pleural effusion, more likely due to myxedema than PNA, no infiltrate on post-thora CXR Iatrogenic L PTX P:   MV support until MS and resp muscle strength improve, likely next 24h Follow CXR Diuresis if he can tolerate from BP and renal perspectives, none ordered currently Change CT to waterseal on 2/24, follow CXR  CARDIOVASCULAR CVL right IJ 2/19>>> A:  Shock, presumed Septic shock + hypothyroidism and bradycardia Bradycardia - myxedema coma Presumed adrenal insuff Mild troponin bump--> likely demand ischemia  mod PA htn Global reduction EF - myxedema likely cause P: Stress dose hydrocort for myxedema associated AI; synthroid (T4) 100 IV qd. I feel cardiac risk precludes T3 at this time Norepi  for MAP >60, wean as able Dopamine at 1-3 until HR staying > 60, then wean No indication pacer. He is likely a poor candidate for same  RENAL A:   AKI (acute on chronic renal failure w/ BL sCr 1.5 to 1.6 range) - ATN sepsis Hematuria with clots, now with suspected urinary catheter obstruction H/o BPH Metabolic acidosis, improved Mild hypomagnesemia P:   Follow BMP, not currently on diuretics, At risk for obstructive uropathy as well Will ask for urology assistance with occluded foley, hematuria, probable need for foley replacement vs irrigation catheter.   GASTROINTESTINAL A:   Weight loss Protein calorie malnutrition ascites P:   Tolerating tube feeds ppi  HEMATOLOGIC A:   Anemia of chronic disease w/out s/sx of bleeding  P:  D/c sq heparin 2/23 Trend cbc, check pm 2/24  INFECTIOUS A:   Apparent Left CAP Septic shock  P:  BCx2  2/19>>> UC 2/19>>> Sputum 2/19>>> RVP 2/19>>>  vanc 2/19>>>2/21 Zosyn 2/19>>>2/19 Cefepime 2/19>>> 2/23 azithro 2/19>>>2/21  Remains culture neg D/c cefepime 2/23 in absence of true LLL infiltrate (post-thoracentesis)  ENDOCRINE A:   Hypothyroidism with mxyedma coma H/o DM  P:   Stress dose hydrocort Continue IV 100 mcg synthroid No T3, at this age with PAH and other associated valvular dz - high risk VT or cardiac decompensation  NEUROLOGIC A:   Acute Encephalopathy  H/o memory loss P:   RASS goal: -1 to 0 PAD protocol  Supportive care  D/c clonazepam on 2/21  FAMILY  - Updates: no family present 2/23  or 30 - Inter-disciplinary family meet or Palliative Care meeting due by:  2/26   Independent CC time 35 minutes  Levy Pupa, MD, PhD 01/22/2015, 1:31 PM [facility] [phone] or if no answer [phone]

## 2015-01-22 NOTE — Progress Notes (Signed)
I saw Mr. Timothy Mayo in the ICU today. No family was present. He was still intubated and sedated, but was following simple commands. He nods his head yes when asked if he is feeling better. Per nursing, he continues to have poor urine output with blood clots, so Urology was consulted and will see him today. Will continue to follow socially until transferred out to floor, at which point we will resume care. Appreciate the ICU team for managing care.   Filomena Jungling, Med Student [facility]  Upper Level Addendum:  I have seen and evaluated this patient along with Daniella DiNizo [facility]) and reviewed the above note, making necessary revisions.  Saralyn Pilar, DO [facility], PGY-2

## 2015-01-22 NOTE — Consult Note (Signed)
Urology Consult  Referring physician:Fatima Blank Reason for referral: blood in urine  Chief Complaint: Hematuria   History of Present Illness: Patient seen by Dr Karsten Ro with BPH and negative prostate biopsies with elevated PSA; consulted for low urine output and blood clots; admitted with septic shock Tea colored urine with some clots yesterday; unsuccessful irrigation today Urine c/s 10 days ago negative Cr 2.6 No hx of GU surgery/stones/UTI according to daughter Nurses irrigated a few clots and last hour 100 ml of clear urine Modifying factors: There are no other modifying factors  Associated signs and symptoms: There are no other associated signs and symptoms Aggravating and relieving factors: There are no other aggravating or relieving factors Severity: Moderate Duration: Improved  Past Medical History  Diagnosis Date  . DIABETES MELLITUS, TYPE II 12/08/2006  . DUODENAL ULCER 12/08/2006  . THORACOTOMY, HX OF 02/27/1994   History reviewed. No pertinent past surgical history.  Medications: I have reviewed the patient's current medications. Allergies: No Known Allergies  History reviewed. No pertinent family history. Social History:  reports that he has quit smoking. He has never used smokeless tobacco. He reports that he does not drink alcohol or use illicit drugs.  ROS: All systems are reviewed and negative except as noted. Rest negative  Physical Exam:  Vital signs in last 24 hours: Temp:  [94.1 F (34.5 C)-98.1 F (36.7 C)] 97 F (36.1 C) (02/24 1200) Pulse Rate:  [35-75] 35 (02/24 1100) Resp:  [11-22] 16 (02/24 1200) BP: (90-131)/(44-80) 131/80 mmHg (02/24 1400) SpO2:  [90 %-100 %] 94 % (02/24 1100) FiO2 (%):  [40 %] 40 % (02/24 1100) Weight:  [78.5 kg (173 lb 1 oz)] 78.5 kg (173 lb 1 oz) (02/24 0311)  Cardiovascular: Skin warm; not flushed Respiratory: Breaths quiet; no shortness of breath Abdomen: No masses Neurological: Normal sensation to  touch Musculoskeletal: Normal motor function arms and legs Lymphatics: No inguinal adenopathy Skin: No rashes Genitourinary:penile edema/clear urine in bag/16 Fr cath  Laboratory Data:  Results for orders placed or performed during the hospital encounter of 01/10/2015 (from the past 72 hour(s))  Glucose, capillary     Status: None   Collection Time: 01/19/15  3:55 PM  Result Value Ref Range   Glucose-Capillary 84 70 - 99 mg/dL   Comment 1 Venous Specimen   Glucose, capillary     Status: Abnormal   Collection Time: 01/19/15  7:15 PM  Result Value Ref Range   Glucose-Capillary 55 (L) 70 - 99 mg/dL  Glucose, capillary     Status: None   Collection Time: 01/19/15  8:23 PM  Result Value Ref Range   Glucose-Capillary 89 70 - 99 mg/dL   Comment 1 Capillary Specimen   Glucose, capillary     Status: None   Collection Time: 01/19/15 11:03 PM  Result Value Ref Range   Glucose-Capillary 97 70 - 99 mg/dL  Procalcitonin     Status: None   Collection Time: 01/20/15  4:35 AM  Result Value Ref Range   Procalcitonin 2.47 ng/mL    Comment:        Interpretation: PCT > 2 ng/mL: Systemic infection (sepsis) is likely, unless other causes are known. (NOTE)         ICU PCT Algorithm               Non ICU PCT Algorithm    ----------------------------     ------------------------------         PCT < 0.25 ng/mL  PCT < 0.1 ng/mL     Stopping of antibiotics            Stopping of antibiotics       strongly encouraged.               strongly encouraged.    ----------------------------     ------------------------------       PCT level decrease by               PCT < 0.25 ng/mL       >= 80% from peak PCT       OR PCT 0.25 - 0.5 ng/mL          Stopping of antibiotics                                             encouraged.     Stopping of antibiotics           encouraged.    ----------------------------     ------------------------------       PCT level decrease by              PCT >=  0.25 ng/mL       < 80% from peak PCT        AND PCT >= 0.5 ng/mL            Continuing antibiotics                                               encouraged.       Continuing antibiotics            encouraged.    ----------------------------     ------------------------------     PCT level increase compared          PCT > 0.5 ng/mL         with peak PCT AND          PCT >= 0.5 ng/mL             Escalation of antibiotics                                          strongly encouraged.      Escalation of antibiotics        strongly encouraged.   Comprehensive metabolic panel     Status: Abnormal   Collection Time: 01/20/15  4:35 AM  Result Value Ref Range   Sodium 134 (L) 135 - 145 mmol/L   Potassium 4.1 3.5 - 5.1 mmol/L   Chloride 105 96 - 112 mmol/L   CO2 21 19 - 32 mmol/L   Glucose, Bld 141 (H) 70 - 99 mg/dL   BUN 78 (H) 6 - 23 mg/dL   Creatinine, Ser 2.58 (H) 0.50 - 1.35 mg/dL   Calcium 8.3 (L) 8.4 - 10.5 mg/dL   Total Protein 5.2 (L) 6.0 - 8.3 g/dL   Albumin 2.5 (L) 3.5 - 5.2 g/dL   AST 70 (H) 0 - 37 U/L   ALT 57 (H) 0 - 53 U/L   Alkaline Phosphatase 115 39 - 117 U/L   Total Bilirubin 0.8  0.3 - 1.2 mg/dL   GFR calc non Af Amer 21 (L) >90 mL/min   GFR calc Af Amer 24 (L) >90 mL/min    Comment: (NOTE) The eGFR has been calculated using the CKD EPI equation. This calculation has not been validated in all clinical situations. eGFR's persistently <90 mL/min signify possible Chronic Kidney Disease.    Anion gap 8 5 - 15  CBC with Differential/Platelet     Status: Abnormal   Collection Time: 01/20/15  4:35 AM  Result Value Ref Range   WBC 7.1 4.0 - 10.5 K/uL   RBC 3.36 (L) 4.22 - 5.81 MIL/uL   Hemoglobin 8.3 (L) 13.0 - 17.0 g/dL   HCT 25.3 (L) 39.0 - 52.0 %   MCV 75.3 (L) 78.0 - 100.0 fL   MCH 24.7 (L) 26.0 - 34.0 pg   MCHC 32.8 30.0 - 36.0 g/dL   RDW 22.2 (H) 11.5 - 15.5 %   Platelets 105 (L) 150 - 400 K/uL    Comment: REPEATED TO VERIFY PLATELET COUNT CONFIRMED BY  SMEAR    Neutrophils Relative % 95 (H) 43 - 77 %   Lymphocytes Relative 3 (L) 12 - 46 %   Monocytes Relative 2 (L) 3 - 12 %   Eosinophils Relative 0 0 - 5 %   Basophils Relative 0 0 - 1 %   Neutro Abs 6.8 1.7 - 7.7 K/uL   Lymphs Abs 0.2 (L) 0.7 - 4.0 K/uL   Monocytes Absolute 0.1 0.1 - 1.0 K/uL   Eosinophils Absolute 0.0 0.0 - 0.7 K/uL   Basophils Absolute 0.0 0.0 - 0.1 K/uL   RBC Morphology BURR CELLS     Comment: SCHISTOCYTES PRESENT (2-5/hpf) ACANTHOCYTES    WBC Morphology MILD LEFT SHIFT (1-5% METAS, OCC MYELO, OCC BANDS)   Glucose, capillary     Status: Abnormal   Collection Time: 01/20/15  8:18 AM  Result Value Ref Range   Glucose-Capillary 156 (H) 70 - 99 mg/dL   Comment 1 Venous Specimen   Urinalysis, Routine w reflex microscopic     Status: Abnormal   Collection Time: 01/20/15  9:36 AM  Result Value Ref Range   Color, Urine RED (A) YELLOW    Comment: URINALYSIS PERFORMED ON SUPERNATANT BIOCHEMICALS MAY BE AFFECTED BY COLOR    APPearance TURBID (A) CLEAR    Comment: GROSSLY BLOODY   Specific Gravity, Urine 1.025 1.005 - 1.030   pH 6.5 5.0 - 8.0   Glucose, UA 100 (A) NEGATIVE mg/dL   Hgb urine dipstick LARGE (A) NEGATIVE   Bilirubin Urine SMALL (A) NEGATIVE   Ketones, ur 15 (A) NEGATIVE mg/dL   Protein, ur >300 (A) NEGATIVE mg/dL   Urobilinogen, UA 1.0 0.0 - 1.0 mg/dL   Nitrite POSITIVE (A) NEGATIVE   Leukocytes, UA TRACE (A) NEGATIVE  Sodium, urine, random     Status: None   Collection Time: 01/20/15  9:36 AM  Result Value Ref Range   Sodium, Ur 52 mmol/L  Osmolality, urine     Status: Abnormal   Collection Time: 01/20/15  9:36 AM  Result Value Ref Range   Osmolality, Ur 323 (L) 390 - 1090 mOsm/kg    Comment: Performed at [facility]  Urine microscopic-add on     Status: Abnormal   Collection Time: 01/20/15  9:36 AM  Result Value Ref Range   Squamous Epithelial / LPF FEW (A) RARE    Comment: MICROSCOPIC EXAM PERFORMED ON UNCONCENTRATED URINE    WBC, UA  3-6 <3 WBC/hpf   RBC / HPF TOO NUMEROUS TO COUNT <3 RBC/hpf   Bacteria, UA MANY (A) RARE   Casts GRANULAR CAST (A) NEGATIVE  Glucose, capillary     Status: Abnormal   Collection Time: 01/20/15 11:35 AM  Result Value Ref Range   Glucose-Capillary 158 (H) 70 - 99 mg/dL   Comment 1 Venous Specimen   Lactate dehydrogenase, body fluid     Status: Abnormal   Collection Time: 01/20/15 12:46 PM  Result Value Ref Range   LD, Fluid 83 (H) 3 - 23 U/L    Comment: (NOTE) Results should be evaluated in conjunction with serum values    Fluid Type-FLDH FLUID     Comment: LEFT PLEURAL CORRECTED ON 02/22 AT 1356: PREVIOUSLY REPORTED AS Pleural, L   Protein, fluid - pleural or peritoneal     Status: None   Collection Time: 01/20/15 12:46 PM  Result Value Ref Range   Total protein, fluid <3.0 g/dL    Comment: (NOTE) No normal range established for this test Results should be evaluated in conjunction with serum values    Fluid Type-FTP FLUID     Comment: LEFT PLEURAL CORRECTED ON 02/22 AT 1356: PREVIOUSLY REPORTED AS Pleural, L   Body fluid cell count with differential     Status: Abnormal   Collection Time: 01/20/15 12:46 PM  Result Value Ref Range   Fluid Type-FCT FLUID     Comment: LEFT PLEURAL CORRECTED ON 02/22 AT 1356: PREVIOUSLY REPORTED AS Pleural, L    Color, Fluid YELLOW (A) YELLOW   Appearance, Fluid HAZY (A) CLEAR   WBC, Fluid 60 0 - 1000 cu mm   Neutrophil Count, Fluid 30 (H) 0 - 25 %   Lymphs, Fluid 64 %   Monocyte-Macrophage-Serous Fluid 6 (L) 50 - 90 %   Eos, Fluid 0 %  Glucose, pleural or peritoneal fluid     Status: None   Collection Time: 01/20/15 12:46 PM  Result Value Ref Range   Glucose, Fluid 157 mg/dL    Comment: (NOTE) No normal range established for this test Results should be evaluated in conjunction with serum values    Fluid Type-FGLU FLUID     Comment: LEFT PLEURAL CORRECTED ON 02/22 AT 1357: PREVIOUSLY REPORTED AS Pleural, L   Body fluid  culture     Status: None (Preliminary result)   Collection Time: 01/20/15 12:46 PM  Result Value Ref Range   Specimen Description FLUID LEFT PLEURAL    Special Requests Normal    Gram Stain      NO WBC SEEN NO ORGANISMS SEEN Performed at [facility]    Culture      NO GROWTH 1 DAY Performed at [facility]    Report Status PENDING   Glucose, capillary     Status: Abnormal   Collection Time: 01/20/15  3:33 PM  Result Value Ref Range   Glucose-Capillary 144 (H) 70 - 99 mg/dL   Comment 1 Venous Specimen   Glucose, capillary     Status: Abnormal   Collection Time: 01/20/15  7:55 PM  Result Value Ref Range   Glucose-Capillary 154 (H) 70 - 99 mg/dL  Glucose, capillary     Status: Abnormal   Collection Time: 01/20/15 11:22 PM  Result Value Ref Range   Glucose-Capillary 105 (H) 70 - 99 mg/dL  Lactate dehydrogenase     Status: Abnormal   Collection Time: 01/21/15  4:00 AM  Result Value Ref Range  LDH 312 (H) 94 - 250 U/L  Protein, total     Status: Abnormal   Collection Time: 01/21/15  4:00 AM  Result Value Ref Range   Total Protein 5.1 (L) 6.0 - 8.3 g/dL  Cholesterol, total     Status: None   Collection Time: 01/21/15  4:00 AM  Result Value Ref Range   Cholesterol 118 0 - 200 mg/dL  Glucose, capillary     Status: Abnormal   Collection Time: 01/21/15  4:46 AM  Result Value Ref Range   Glucose-Capillary 147 (H) 70 - 99 mg/dL  Glucose, capillary     Status: Abnormal   Collection Time: 01/21/15  7:38 AM  Result Value Ref Range   Glucose-Capillary 176 (H) 70 - 99 mg/dL   Comment 1 Venous Specimen   Glucose, capillary     Status: Abnormal   Collection Time: 01/21/15 11:30 AM  Result Value Ref Range   Glucose-Capillary 157 (H) 70 - 99 mg/dL   Comment 1 Venous Specimen   Glucose, capillary     Status: Abnormal   Collection Time: 01/21/15  3:58 PM  Result Value Ref Range   Glucose-Capillary 129 (H) 70 - 99 mg/dL   Comment 1 Venous Specimen   Glucose,  capillary     Status: Abnormal   Collection Time: 01/21/15  7:44 PM  Result Value Ref Range   Glucose-Capillary 128 (H) 70 - 99 mg/dL   Comment 1 Venous Specimen   Glucose, capillary     Status: Abnormal   Collection Time: 01/22/15 12:15 AM  Result Value Ref Range   Glucose-Capillary 126 (H) 70 - 99 mg/dL   Comment 1 Venous Specimen   Glucose, capillary     Status: Abnormal   Collection Time: 01/22/15  4:11 AM  Result Value Ref Range   Glucose-Capillary 165 (H) 70 - 99 mg/dL  Basic metabolic panel     Status: Abnormal   Collection Time: 01/22/15  5:15 AM  Result Value Ref Range   Sodium 134 (L) 135 - 145 mmol/L   Potassium 3.7 3.5 - 5.1 mmol/L   Chloride 105 96 - 112 mmol/L   CO2 21 19 - 32 mmol/L   Glucose, Bld 157 (H) 70 - 99 mg/dL   BUN 106 (H) 6 - 23 mg/dL   Creatinine, Ser 2.65 (H) 0.50 - 1.35 mg/dL   Calcium 8.0 (L) 8.4 - 10.5 mg/dL   GFR calc non Af Amer 20 (L) >90 mL/min   GFR calc Af Amer 23 (L) >90 mL/min    Comment: (NOTE) The eGFR has been calculated using the CKD EPI equation. This calculation has not been validated in all clinical situations. eGFR's persistently <90 mL/min signify possible Chronic Kidney Disease.    Anion gap 8 5 - 15  CBC     Status: Abnormal   Collection Time: 01/22/15  5:15 AM  Result Value Ref Range   WBC 7.6 4.0 - 10.5 K/uL   RBC 3.06 (L) 4.22 - 5.81 MIL/uL   Hemoglobin 7.5 (L) 13.0 - 17.0 g/dL   HCT 23.2 (L) 39.0 - 52.0 %   MCV 75.8 (L) 78.0 - 100.0 fL   MCH 24.5 (L) 26.0 - 34.0 pg   MCHC 32.3 30.0 - 36.0 g/dL   RDW 22.7 (H) 11.5 - 15.5 %   Platelets 79 (L) 150 - 400 K/uL    Comment: REPEATED TO VERIFY PLATELET COUNT CONFIRMED BY SMEAR   Glucose, capillary     Status: Abnormal  Collection Time: 01/22/15  9:10 AM  Result Value Ref Range   Glucose-Capillary 136 (H) 70 - 99 mg/dL  Glucose, capillary     Status: Abnormal   Collection Time: 01/22/15 12:12 PM  Result Value Ref Range   Glucose-Capillary 112 (H) 70 - 99 mg/dL    Comment 1 Venous Specimen    Recent Results (from the past 240 hour(s))  Culture, blood (routine x 2)     Status: None (Preliminary result)   Collection Time: 01/03/2015 11:59 AM  Result Value Ref Range Status   Specimen Description BLOOD RIGHT FOREARM  Final   Special Requests BOTTLES DRAWN AEROBIC ONLY 5CC  Final   Culture   Final           BLOOD CULTURE RECEIVED NO GROWTH TO DATE CULTURE WILL BE HELD FOR 5 DAYS BEFORE ISSUING A FINAL NEGATIVE REPORT Performed at [facility]    Report Status PENDING  Incomplete  Culture, blood (routine x 2)     Status: None (Preliminary result)   Collection Time: 01/13/2015 12:02 PM  Result Value Ref Range Status   Specimen Description BLOOD LEFT FOREARM  Final   Special Requests BOTTLES DRAWN AEROBIC ONLY 3CC  Final   Culture   Final           BLOOD CULTURE RECEIVED NO GROWTH TO DATE CULTURE WILL BE HELD FOR 5 DAYS BEFORE ISSUING A FINAL NEGATIVE REPORT Performed at [facility]    Report Status PENDING  Incomplete  Culture, blood (routine x 2)     Status: None (Preliminary result)   Collection Time: 01/20/2015  1:27 PM  Result Value Ref Range Status   Specimen Description BLOOD CENTRAL LINE  Final   Special Requests BOTTLES DRAWN AEROBIC AND ANAEROBIC 10CC  Final   Culture   Final           BLOOD CULTURE RECEIVED NO GROWTH TO DATE CULTURE WILL BE HELD FOR 5 DAYS BEFORE ISSUING A FINAL NEGATIVE REPORT Performed at [facility]    Report Status PENDING  Incomplete  Culture, respiratory (NON-Expectorated)     Status: None   Collection Time: 01/18/2015  1:53 PM  Result Value Ref Range Status   Specimen Description TRACHEAL ASPIRATE  Final   Special Requests NONE  Final   Gram Stain   Final    MODERATE WBC PRESENT,BOTH PMN AND MONONUCLEAR RARE SQUAMOUS EPITHELIAL CELLS PRESENT RARE GRAM POSITIVE COCCI IN PAIRS Performed at [facility]    Culture   Final    Non-Pathogenic Oropharyngeal-type Flora  Isolated. Performed at [facility]    Report Status 01/20/2015 FINAL  Final  MRSA PCR Screening     Status: None   Collection Time: 01/04/2015  4:12 PM  Result Value Ref Range Status   MRSA by PCR NEGATIVE NEGATIVE Final    Comment:        The GeneXpert MRSA Assay (FDA approved for NASAL specimens only), is one component of a comprehensive MRSA colonization surveillance program. It is not intended to diagnose MRSA infection nor to guide or monitor treatment for MRSA infections.   Culture, blood (x 2)     Status: None (Preliminary result)   Collection Time: 01/13/2015  4:30 PM  Result Value Ref Range Status   Specimen Description BLOOD RIGHT HAND  Final   Special Requests BOTTLES DRAWN AEROBIC ONLY 3CC  Final   Culture   Final  BLOOD CULTURE RECEIVED NO GROWTH TO DATE CULTURE WILL BE HELD FOR 5 DAYS BEFORE ISSUING A FINAL NEGATIVE REPORT Performed at [facility]    Report Status PENDING  Incomplete  Urine culture     Status: None   Collection Time: 01/18/15 10:39 AM  Result Value Ref Range Status   Specimen Description URINE, CATHETERIZED  Final   Special Requests Normal  Final   Colony Count NO GROWTH Performed at [facility]   Final   Culture NO GROWTH Performed at [facility]   Final   Report Status 01/19/2015 FINAL  Final  Respiratory virus panel     Status: Abnormal   Collection Time: 01/18/15 12:13 PM  Result Value Ref Range Status   Source - RVPAN NASAL SWAB  Corrected   Respiratory Syncytial Virus A Negative Negative Final   Respiratory Syncytial Virus B Negative Negative Final   Influenza A Positive (A) Negative Final    Comment: Positive for Influenza A 2009 H1N1   Influenza B Negative Negative Final   Parainfluenza 1 Negative Negative Final   Parainfluenza 2 Negative Negative Final   Parainfluenza 3 Negative Negative Final   Metapneumovirus Negative Negative Final   Rhinovirus Negative Negative Final    Adenovirus Negative Negative Final    Comment: (NOTE) Performed At: [facility] [place], [place] [id number] Lindon Romp MD TA:[id number]   Body fluid culture     Status: None (Preliminary result)   Collection Time: 01/20/15 12:46 PM  Result Value Ref Range Status   Specimen Description FLUID LEFT PLEURAL  Final   Special Requests Normal  Final   Gram Stain   Final    NO WBC SEEN NO ORGANISMS SEEN Performed at [facility]    Culture   Final    NO GROWTH 1 DAY Performed at [facility]    Report Status PENDING  Incomplete   Creatinine:  Recent Labs  01/03/2015 1134 01/23/2015 1229 01/07/2015 1315 01/18/15 0330 01/19/15 0500 01/20/15 0435 01/22/15 0515  CREATININE 2.39* 2.30* 2.32* 2.43* 2.52* 2.58* 2.65*    Xrays: See report/chart none  Impression/Assessment:  Penile edema secondary to diffuse pitting edema/fluid retention- very good prognosis if pt recovers/diureses Intermittent blood in urine  Plan:  Irrigate foley as necessary No active management at this time Reasonable to have him f/up with Dr Karsten Ro as outpt depending on his recovery etc  Amaiah Cristiano A 01/22/2015, 3:29 PM

## 2015-01-22 NOTE — Progress Notes (Signed)
NUTRITION FOLLOW-UP  INTERVENTION:  Needs bowel regimen, discussed with RN during rounds  Increase Vital High Protein by 10 ml every 4  hours to goal rate of 45 ml/hr via OG tube  30 ml Prostat TID  MVI daily  Tube feeding regimen provides 1380 kcal (101% of needs), 139 grams of protein, and 902 ml of H2O.   NUTRITION DIAGNOSIS: Inadequate oral intake related to inability to eat as evidenced by NPO status; ongoing.   Goal: Pt to meet >/= 90% of their estimated nutrition needs; not met.    Monitor:  TFf tolerance, labs, weight, goals of care   ASSESSMENT: 81 year old male admitted from home on 2/19 after being found w/ altered mental status and increased labored breathing. Admitted to ICU w/ working dx of acute respiratory failure in setting of CAP w/ resultant septic shock/MODS.   Patient is currently intubated on ventilator support MV: 7.5 L/min Temp (24hrs), Avg:97.4 F (36.3 C), Min:94.1 F (34.5 C), Max:98.1 F (36.7 C)  Sodium low BUN/Cr elevated Pt postitive 10 L since admission Pt with 2 + -- 3 + edema   Pt discussed during ICU rounds and with RN.  TF held due this am due to residuals of 890 ml.  Vital High protein has now restarted at 30 ml/hr. Temp and MV decreased, estimated needs adjusted  Height: Ht Readings from Last 1 Encounters:  01/15/2015 $RemoveB'5\' 9"'[id number]$  (1.753 m)    Weight: Wt Readings from Last 1 Encounters:  01/22/15 173 lb 1 oz (78.5 kg)  Admission weight 130 lb (59 kg) 2/19  Ideal Body Weight: 72.7 kg   BMI:  Body mass index is 25.54 kg/(m^2).  Estimated Nutritional Needs: [id number] Protein: 136-152 (1.8-2 g/kg) Fluid: per md  Skin: pressure ulcer stage II on coccyx    Diet Order:     Intake/Output Summary (Last 24 hours) at 01/22/15 1354 Last data filed at 01/22/15 1300  Gross per 24 hour  Intake 1984.9 ml  Output   1150 ml  Net  834.9 ml    Last BM:2/18  Labs:   Recent Labs Lab 01/09/2015 1315 01/18/15 0330 01/19/15 0500  01/20/15 0435 01/22/15 0515  NA 133* 137 133* 134* 134*  K 4.4 4.6 4.3 4.1 3.7  CL 102 107 106 105 105  CO2 $Re'26 20 20 21 21  'JgI$ BUN 56* 60* 67* 78* 106*  CREATININE 2.32* 2.43* 2.52* 2.58* 2.65*  CALCIUM 8.3* 8.4 8.2* 8.3* 8.0*  MG 1.9 1.7  --   --   --   PHOS 3.2 3.1  --   --   --   GLUCOSE 133* 83 129* 141* 157*    CBG (last 3)   Recent Labs  01/22/15 0411 01/22/15 0910 01/22/15 1212  GLUCAP 165* 136* 112*    Scheduled Meds: . antiseptic oral rinse  7 mL Mouth Rinse Q2H  . chlorhexidine  15 mL Mouth Rinse BID  . hydrocortisone sodium succinate  50 mg Intravenous Q6H  . insulin aspart  0-9 Units Subcutaneous 6 times per day  . levothyroxine  100 mcg Intravenous Daily  . pantoprazole (PROTONIX) IV  40 mg Intravenous Q24H  . sodium chloride  10-40 mL Intracatheter Q12H    Continuous Infusions: . DOPamine 2.033 mcg/kg/min (01/22/15 1300)  . feeding supplement (VITAL HIGH PROTEIN) Stopped (01/22/15 0900)  . fentaNYL infusion INTRAVENOUS 100 mcg/hr (01/22/15 1300)  . norepinephrine (LEVOPHED) Adult infusion Stopped (01/20/15 1600)   [place], LDN, CNSC [phone] Pager [phone] After  Hours Pager

## 2015-01-23 ENCOUNTER — Inpatient Hospital Stay (HOSPITAL_COMMUNITY): Payer: Medicare Other

## 2015-01-23 LAB — BASIC METABOLIC PANEL
Anion gap: 11 (ref 5–15)
BUN: 123 mg/dL — ABNORMAL HIGH (ref 6–23)
CHLORIDE: 102 mmol/L (ref 96–112)
CO2: 21 mmol/L (ref 19–32)
Calcium: 8.2 mg/dL — ABNORMAL LOW (ref 8.4–10.5)
Creatinine, Ser: 2.63 mg/dL — ABNORMAL HIGH (ref 0.50–1.35)
GFR calc non Af Amer: 20 mL/min — ABNORMAL LOW (ref >90)
GFR, EST AFRICAN AMERICAN: 24 mL/min — AB (ref >90)
Glucose, Bld: 135 mg/dL — ABNORMAL HIGH (ref 70–99)
Potassium: 3.6 mmol/L (ref 3.5–5.1)
Sodium: 134 mmol/L — ABNORMAL LOW (ref 135–145)

## 2015-01-23 LAB — GLUCOSE, CAPILLARY
GLUCOSE-CAPILLARY: 182 mg/dL — AB (ref 70–99)
GLUCOSE-CAPILLARY: 133 mg/dL — AB (ref 70–99)
Glucose-Capillary: 132 mg/dL — ABNORMAL HIGH (ref 70–99)
Glucose-Capillary: 162 mg/dL — ABNORMAL HIGH (ref 70–99)
Glucose-Capillary: 123 mg/dL — ABNORMAL HIGH (ref 70–99)
Glucose-Capillary: 129 mg/dL — ABNORMAL HIGH (ref 70–99)

## 2015-01-23 LAB — CULTURE, BLOOD (ROUTINE X 2)
CULTURE: NO GROWTH
CULTURE: NO GROWTH
Culture: NO GROWTH
Culture: NO GROWTH

## 2015-01-23 LAB — CBC
HEMATOCRIT: 29.2 % — AB (ref 39.0–52.0)
HEMOGLOBIN: 9.6 g/dL — AB (ref 13.0–17.0)
MCH: 24.3 pg — AB (ref 26.0–34.0)
MCHC: 32.9 g/dL (ref 30.0–36.0)
MCV: 73.9 fL — AB (ref 78.0–100.0)
PLATELETS: 64 10*3/uL — AB (ref 150–400)
RBC: 3.95 MIL/uL — AB (ref 4.22–5.81)
RDW: 22.3 % — ABNORMAL HIGH (ref 11.5–15.5)
WBC: 8.4 10*3/uL (ref 4.0–10.5)

## 2015-01-23 LAB — MISCELLANEOUS TEST

## 2015-01-23 NOTE — Progress Notes (Signed)
I saw Mr. Wolinsky in the ICU today. No family was present. He was still intubated and sedated, but was following simple commands. He nods his head yes when asked if he is feeling better. His UOP improved after irrigation yesterday and urology recommended continuing to irrigate his foley catheter. Per nursing, the plan is to try to wean the vent today with hope of extubating tomorrow. he continues to have poor urine output with blood clots, so Urology was consulted and will see him today. Will continue to follow socially until transferred out to floor, at which point we will resume care. Appreciate the ICU team for managing care.   Filomena Jungling, Med Student [facility]  Upper Level Addendum:  I have seen and evaluated this patient along with Daniella DiNizo [facility]) and reviewed the above note, making necessary revisions.  Remains intubated. Continue to follow.  Saralyn Pilar, DO [facility], PGY-2

## 2015-01-23 NOTE — Progress Notes (Signed)
PULMONARY / CRITICAL CARE MEDICINE   Name: Timothy Mayo MRN: [id number] DOB: 10-09-1925    ADMISSION DATE:  29-Jan-2015 CONSULTATION DATE:  2/19  REFERRING MD :  Gilmore Laroche  CHIEF COMPLAINT:  Acute respiratory failure septic shock   INITIAL PRESENTATION:  81 year old male admitted from home on 2/19 after being found w/ altered mental status and increased labored breathing. Admitted to ICU w/ working dx of acute respiratory failure in setting of L CAP w/ resultant septic shock/MODS. W/u revealed severe hypothyroidism and apparent myxedema coma  STUDIES:  CT head 2/19: NAD Echo 2023-01-30 45-50% global, pa pressure 65 mod pa htn  SIGNIFICANT EVENTS: 2/19: discussed prognosis w/ family. Made DNR in the event of cardiac arrest but otherwise aggressive care.  30-Jan-2023- TSH over 80, IV synthroid , steroids started 30-Jan-2023 renal US- ascites, med renal dz  SUBJECTIVE:  A bit stronger  Tolerating some PSV   VITAL SIGNS: Temp:  [89.1 F (31.7 C)-96.8 F (36 C)] 89.1 F (31.7 C) (02/25 1500) Pulse Rate:  [49-134] 70 (02/25 1500) Resp:  [12-23] 14 (02/25 1500) BP: (98-145)/(51-81) 127/66 mmHg (02/25 1500) SpO2:  [60 %-100 %] 98 % (02/25 1500) FiO2 (%):  [40 %] 40 % (02/25 1215) Weight:  [77.5 kg (170 lb 13.7 oz)] 77.5 kg (170 lb 13.7 oz) (02/25 0346) HEMODYNAMICS: CVP:  [3 mmHg-9 mmHg] 8 mmHg VENTILATOR SETTINGS: Vent Mode:  [-] PRVC FiO2 (%):  [40 %] 40 % Set Rate:  [12 bmp] 12 bmp Vt Set:  [600 mL] 600 mL PEEP:  [5 cmH20] 5 cmH20 Pressure Support:  [8 cmH20] 8 cmH20 Plateau Pressure:  [13 cmH20-25 cmH20] 20 cmH20 INTAKE / OUTPUT:  Intake/Output Summary (Last 24 hours) at 01/23/15 1626 Last data filed at 01/23/15 1500  Gross per 24 hour  Intake 1354.73 ml  Output   1065 ml  Net 289.73 ml    PHYSICAL EXAMINATION: General:  sedated Neuro:  rass 0, not following commands HEENT:  MM dry, no JVD NCAT Cardiovascular:  rrr s1 s 2 Lungs:  ronchi throughout, unchanged Abdomen:  NT +  bowel sounds, wnl  GU: severe penile and scrotal edema, foley in place but meatus difficulty to find Musculoskeletal:  Intact  Skin:  3 plus edema, pitting   LABS:  CBC  Recent Labs Lab 01/22/15 0515 01/22/15 1953 01/23/15 0400  WBC 7.6 7.9 8.4  HGB 7.5* 8.2* 9.6*  HCT 23.2* 24.7* 29.2*  PLT 79* 137* 64*   Coag's  Recent Labs Lab 29-Jan-2015 1315  APTT 30  INR 1.24   BMET  Recent Labs Lab 01/20/15 0435 01/22/15 0515 01/23/15 0400  NA 134* 134* 134*  K 4.1 3.7 3.6  CL 105 105 102  CO2 21 21 21   BUN 78* 106* 123*  CREATININE 2.58* 2.65* 2.63*  GLUCOSE 141* 157* 135*   Electrolytes  Recent Labs Lab Jan 29, 2015 1315 01-30-15 0330  01/20/15 0435 01/22/15 0515 01/23/15 0400  CALCIUM 8.3* 8.4  < > 8.3* 8.0* 8.2*  MG 1.9 1.7  --   --   --   --   PHOS 3.2 3.1  --   --   --   --   < > = values in this interval not displayed. Sepsis Markers  Recent Labs Lab 01/29/2015 1228 01-29-15 1316 01/30/2015 0945 01/19/15 0500 01/20/15 0435  LATICACIDVEN 2.17* 1.8  --   --   --   PROCALCITON  --   --  1.34 2.92 2.47   ABG  Recent Labs Lab 01/23/2015 1439  PHART 7.488*  PCO2ART 28.6*  PO2ART 528.0*   Liver Enzymes  Recent Labs Lab 01/23/2015 1134 01/23/2015 1315 01/20/15 0435  AST 148* 170* 70*  ALT 79* 90* 57*  ALKPHOS 141* 148* 115  BILITOT 0.9 1.2 0.8  ALBUMIN 2.8* 2.7* 2.5*   Cardiac Enzymes  Recent Labs Lab 01/21/2015 1315 01/15/2015 1918 01/18/15 0234  TROPONINI 0.08* 0.08* 0.10*   Glucose  Recent Labs Lab 01/22/15 0910 01/22/15 1212 01/22/15 1644 01/22/15 1943 01/22/15 2352 01/23/15 0335  GLUCAP 136* 112* 135* 156* 162* 132*    Imaging Dg Chest Port 1 View  01/22/2015   CLINICAL DATA:  Status post thoracentesis yesterday; assess support tube positioning  EXAM: PORTABLE CHEST - 1 VIEW  COMPARISON:  Portable chest x-ray of January 21, 2025 being  FINDINGS: The lungs are adequately inflated. There remains left lower lobe atelectasis or  pneumonia. There is no significant pleural effusion. There is no pneumothorax. The small caliber chest tube on the left has its pigtail overlying the posterior aspect of the eighth rib and appears unchanged. The cardiopericardial silhouette remains mildly enlarged. The pulmonary vascularity is normal. The right internal jugular venous catheter tip projects over the mid to distal SVC. The endotracheal tube tip lies 5.2 cm above the carina. The esophagogastric tube tip projects below the inferior margin of the image.  IMPRESSION: There is persistent left lower lobe atelectasis or pneumonia. There is no significant pleural effusion and no pneumothorax. The support tubes and lines are in appropriate position .   Electronically Signed   By: David  [place]   On: 01/22/2015 07:21    ASSESSMENT / PLAN:  PULMONARY OETT 2/19 A: acute hypoxic respiratory Failure  Initially suspected L CAP, now less likely L pleural effusion, more likely due to myxedema than PNA, no infiltrate on post-thora CXR Iatrogenic L PTX P:   MV support until MS and resp muscle strength improve, hope he will be able 2/26 Follow CXR Diuresis if he can tolerate from BP and renal perspectives, none ordered currently Will leave CT on water seal for another day, plan d/c on 2/26 if no PTX present  CARDIOVASCULAR CVL right IJ 2/19>>> A:  Shock, presumed Septic shock + hypothyroidism and bradycardia Bradycardia - myxedema coma Presumed adrenal insuff Mild troponin bump--> likely demand ischemia  mod PA htn Global reduction EF - myxedema likely cause P: Stress dose hydrocort for myxedema associated AI; synthroid (T4) 100 IV qd. I feel cardiac risk precludes T3 at this time Norepi  for MAP >60, wean as able Dopamine at 1-3 until HR staying > 60, then wean No indication pacer. He is likely a poor candidate for same  RENAL A:   AKI (acute on chronic renal failure w/ BL sCr 1.5 to 1.6 range) - ATN sepsis Hematuria with clots, now  with suspected urinary catheter obstruction H/o BPH Metabolic acidosis, improved Mild hypomagnesemia P:   Follow BMP, not currently on diuretics, At risk for obstructive uropathy as well Appreciate urology's help with foley and hematuria  GASTROINTESTINAL A:   Weight loss Protein calorie malnutrition ascites P:   Tolerating tube feeds ppi  HEMATOLOGIC A:   Anemia of chronic disease w/out s/sx of bleeding  P:  D/c sq heparin 2/23 Trend cbc, check pm 2/24  INFECTIOUS A:   Apparent Left CAP Septic shock  P:  BCx2 2/19>>> negative UC 2/19>>> negative Sputum 2/19>>> normal flora RVP 2/19>>> POSITIVE for Flu A  vanc 2/19>>>2/21 Zosyn  2/19>>>2/19 Cefepime 2/19>>> 2/23 azithro 2/19>>>2/21  Remains culture neg D/c cefepime 2/23 in absence of true LLL infiltrate (post-thoracentesis)  ENDOCRINE A:   Hypothyroidism with mxyedma coma H/o DM  P:   Stress dose hydrocort Continue IV 100 mcg synthroid No T3, at this age with PAH and other associated valvular dz - high risk VT or cardiac decompensation  NEUROLOGIC A:   Acute Encephalopathy  H/o memory loss P:   RASS goal: -1 to 0 PAD protocol  Supportive care  D/c clonazepam on 2/21  FAMILY  - Updates: discussed care with pt's daughter at bedside on 2/25 - Inter-disciplinary family meet or Palliative Care meeting due by:  2/26   Independent CC time 35 minutes  Levy Pupa, MD, PhD 01/23/2015, 4:26 PM [facility] [phone] or if no answer [phone]

## 2015-01-24 ENCOUNTER — Inpatient Hospital Stay (HOSPITAL_COMMUNITY): Payer: Medicare Other

## 2015-01-24 LAB — BODY FLUID CULTURE
Culture: NO GROWTH
Gram Stain: NONE SEEN
Special Requests: NORMAL

## 2015-01-24 LAB — GLUCOSE, CAPILLARY
GLUCOSE-CAPILLARY: 103 mg/dL — AB (ref 70–99)
GLUCOSE-CAPILLARY: 66 mg/dL — AB (ref 70–99)
Glucose-Capillary: 124 mg/dL — ABNORMAL HIGH (ref 70–99)
Glucose-Capillary: 163 mg/dL — ABNORMAL HIGH (ref 70–99)
Glucose-Capillary: 115 mg/dL — ABNORMAL HIGH (ref 70–99)
Glucose-Capillary: 156 mg/dL — ABNORMAL HIGH (ref 70–99)
Glucose-Capillary: 79 mg/dL (ref 70–99)
Glucose-Capillary: 114 mg/dL — ABNORMAL HIGH (ref 70–99)
Glucose-Capillary: 62 mg/dL — ABNORMAL LOW (ref 70–99)

## 2015-01-24 LAB — BASIC METABOLIC PANEL
ANION GAP: 9 (ref 5–15)
BUN: 133 mg/dL — ABNORMAL HIGH (ref 6–23)
CHLORIDE: 104 mmol/L (ref 96–112)
CO2: 21 mmol/L (ref 19–32)
Calcium: 8 mg/dL — ABNORMAL LOW (ref 8.4–10.5)
Creatinine, Ser: 2.6 mg/dL — ABNORMAL HIGH (ref 0.50–1.35)
GFR calc non Af Amer: 21 mL/min — ABNORMAL LOW (ref >90)
GFR, EST AFRICAN AMERICAN: 24 mL/min — AB (ref >90)
Glucose, Bld: 155 mg/dL — ABNORMAL HIGH (ref 70–99)
POTASSIUM: 3.5 mmol/L (ref 3.5–5.1)
Sodium: 134 mmol/L — ABNORMAL LOW (ref 135–145)

## 2015-01-24 MED ORDER — DEXTROSE 50 % IV SOLN
25.0000 mL | Freq: Once | INTRAVENOUS | Status: AC
  Administered 2015-01-24: 25 mL via INTRAVENOUS

## 2015-01-24 MED ORDER — DEXTROSE-NACL 5-0.9 % IV SOLN: INTRAVENOUS | Status: DC

## 2015-01-24 MED ORDER — CETYLPYRIDINIUM CHLORIDE 0.05 % MT LIQD
7.0000 mL | Freq: Two times a day (BID) | OROMUCOSAL | Status: DC
Start: 2015-01-24 — End: 2015-01-25
  Administered 2015-01-24: 7 mL via OROMUCOSAL

## 2015-01-24 MED ORDER — CHLORHEXIDINE GLUCONATE 0.12 % MT SOLN
15.0000 mL | Freq: Two times a day (BID) | OROMUCOSAL | Status: DC
  Administered 2015-01-24: 15 mL via OROMUCOSAL
  Filled 2015-01-24: qty 15

## 2015-01-24 MED ORDER — DEXTROSE 50 % IV SOLN
INTRAVENOUS | Status: AC
  Filled 2015-01-24: qty 50

## 2015-01-24 MED ORDER — DEXTROSE-NACL 5-0.9 % IV SOLN
INTRAVENOUS | Status: DC
  Administered 2015-01-25: via INTRAVENOUS

## 2015-01-24 NOTE — Progress Notes (Signed)
eLink Physician-Brief Progress Note Patient Name: Timothy Mayo DOB: [birth year] MRN: [id number]   Date of Service  01/24/2015  HPI/Events of Note  Patient with hypoglycemia with blood sugars 62-66.  Was extubated and off TFs.  No IVFs  eICU Interventions  Plan: Start D5NS at 30 cc/hr Continue to monitor blood sugars     Intervention Category Intermediate Interventions: Other:  DETERDING,ELIZABETH 01/24/2015, 11:53 PM

## 2015-01-24 NOTE — Procedures (Signed)
Extubation Procedure Note  Patient Details:   Name: Timothy Mayo DOB: [birth year] MRN: [id number]   Airway Documentation:     Evaluation  O2 sats: stable throughout Complications: No apparent complications Patient did tolerate procedure well. Bilateral Breath Sounds: Rhonchi Suctioning: Airway No  Patient tolerated wean. MD ordered to extubate. Positive for cuff leak. Patient extubated to a 2 Lpm nasal cannula. No signs of dyspnea or stridor. Patient will not cough on command. RN aware. Will continue to monitor.   Ancil Boozer 01/24/2015, 12:26 PM

## 2015-01-24 NOTE — Progress Notes (Signed)
I saw Mr. Beichler in the ICU today. No family was present. He was still intubated and sedated, but was following simple commands and appeared to be more alert. Per nursing, the patient developed SVT during vent wean yesterday. The dopamine was stopped and the patient had a period of bradycardia and asystole, but recovered. Vent is currently being weaned with goal of extubating tomorrow. Will continue to follow socially until transferred out to floor, at which point we will resume care. Appreciate the ICU team for managing care.   Filomena Jungling, Med Student [facility]  Upper Level Addendum:  I have seen and evaluated this patient along with Daniella DiNizo [facility]) and reviewed the above note, making necessary revisions.  Additionally discussed with nursing, agree with above note and seems like possible extubation 2/27 if tolerates weaning. Will continue to follow and resume care when no longer requiring to remain in ICU.  Saralyn Pilar, DO [facility], PGY-2

## 2015-01-24 NOTE — Progress Notes (Signed)
PULMONARY / CRITICAL CARE MEDICINE   Name: Timothy Mayo MRN: [id number] DOB: [birth year]    ADMISSION DATE:  01/16/2015 CONSULTATION DATE:  2/19  REFERRING MD :  Gilmore Laroche  CHIEF COMPLAINT:  Acute respiratory failure septic shock   INITIAL PRESENTATION:  81 year old male admitted from home on 2/19 after being found w/ altered mental status and increased labored breathing. Admitted to ICU w/ working dx of acute respiratory failure in setting of L CAP w/ resultant septic shock/MODS. W/u revealed severe hypothyroidism and apparent myxedema coma  STUDIES:  CT head 2/19: NAD Echo 01/30/2023 45-50% global, pa pressure 65 mod pa htn  SIGNIFICANT EVENTS: 2/19: discussed prognosis w/ family. Made DNR in the event of cardiac arrest but otherwise aggressive care.  01/30/2023- TSH over 80, IV synthroid , steroids started 30-Jan-2023 renal US- ascites, med renal dz  SUBJECTIVE:  Doing well on PSV this am, better than 2/25 Following commands although still gets sleepy at times.   VITAL SIGNS: Temp:  [89.1 F (31.7 C)-97.2 F (36.2 C)] 96.6 F (35.9 C) (02/26 1000) Pulse Rate:  [49-134] 75 (02/26 1000) Resp:  [10-24] 10 (02/26 1000) BP: (111-151)/(46-82) 123/51 mmHg (02/26 1000) SpO2:  [60 %-100 %] 99 % (02/26 1000) FiO2 (%):  [40 %] 40 % (02/26 1000) Weight:  [78.1 kg (172 lb 2.9 oz)] 78.1 kg (172 lb 2.9 oz) (02/26 0356) HEMODYNAMICS: CVP:  [6 mmHg-12 mmHg] 12 mmHg VENTILATOR SETTINGS: Vent Mode:  [-] PSV;CPAP FiO2 (%):  [40 %] 40 % Set Rate:  [12 bmp] 12 bmp Vt Set:  [600 mL] 600 mL PEEP:  [5 cmH20] 5 cmH20 Pressure Support:  [5 cmH20-12 cmH20] 5 cmH20 Plateau Pressure:  [20 cmH20-34 cmH20] 34 cmH20 INTAKE / OUTPUT:  Intake/Output Summary (Last 24 hours) at 01/24/15 1155 Last data filed at 01/24/15 1100  Gross per 24 hour  Intake 1480.59 ml  Output   1285 ml  Net 195.59 ml    PHYSICAL EXAMINATION: General:  sedated Neuro:  rass 0, not following commands HEENT:  MM dry, no JVD  NCAT Cardiovascular:  rrr s1 s 2 Lungs:  ronchi throughout, unchanged Abdomen:  NT + bowel sounds, wnl  GU: severe penile and scrotal edema, foley in place but meatus difficulty to find Musculoskeletal:  Intact  Skin:  3 plus edema, pitting   LABS:  CBC  Recent Labs Lab 01/22/15 0515 01/22/15 1953 01/23/15 0400  WBC 7.6 7.9 8.4  HGB 7.5* 8.2* 9.6*  HCT 23.2* 24.7* 29.2*  PLT 79* 137* 64*   Coag's  Recent Labs Lab 01/24/2015 1315  APTT 30  INR 1.24   BMET  Recent Labs Lab 01/22/15 0515 01/23/15 0400 01/24/15 0417  NA 134* 134* 134*  K 3.7 3.6 3.5  CL 105 102 104  CO2 BUN 106* 123* 133*  CREATININE 2.65* 2.63* 2.60*  GLUCOSE 157* 135* 155*   Electrolytes  Recent Labs Lab 01/01/2015 1315 01/30/2015 0330  01/22/15 0515 01/23/15 0400 01/24/15 0417  CALCIUM 8.3* 8.4  < > 8.0* 8.2* 8.0*  MG 1.9 1.7  --   --   --   --   PHOS 3.2 3.1  --   --   --   --   < > = values in this interval not displayed. Sepsis Markers  Recent Labs Lab 01/10/2015 1228 12/30/2014 1316 01/30/2015 0945 01/19/15 0500 01/20/15 0435  LATICACIDVEN 2.17* 1.8  --   --   --   PROCALCITON  --   --  1.34 2.92 2.47   ABG  Recent Labs Lab 01/06/2015 1439  PHART 7.488*  PCO2ART 28.6*  PO2ART 528.0*   Liver Enzymes  Recent Labs Lab 01/18/2015 1315 01/20/15 0435  AST 170* 70*  ALT 90* 57*  ALKPHOS 148* 115  BILITOT 1.2 0.8  ALBUMIN 2.7* 2.5*   Cardiac Enzymes  Recent Labs Lab 01/18/2015 1315 01/11/2015 1918 01/18/15 0234  TROPONINI 0.08* 0.08* 0.10*   Glucose  Recent Labs Lab 01/23/15 1742 01/23/15 2052 01/23/15 2349 01/24/15 0359 01/24/15 0820 01/24/15 1112  GLUCAP 133* 129* 124* 163* 115* 156*    Imaging Dg Chest Port 1 View  01/23/2015   CLINICAL DATA:  Shortness of breath, acute respiratory failure, left-sided pneumothorax.  EXAM: PORTABLE CHEST - 1 VIEW  COMPARISON:  Portable chest x-ray of January 22, 2015  FINDINGS: The lungs are adequately inflated.  There remains mild left basilar atelectasis. No significant pleural effusion is demonstrated and there is no pneumothorax. The cardiac silhouette and pulmonary vascularity are stable. Small caliber chest tube is unchanged in position. The right internal jugular venous catheter tip projects over the midportion of the SVC. The endotracheal tube tip lies 4.5 cm above the crotch of the carina. The esophagogastric tube tip projects below the inferior margin of the image.  IMPRESSION: Persistent left basilar atelectasis. There is no pneumothorax or significant pleural effusion. There has not been significant interval change in the appearance of chest or support tubes since yesterday's study.   Electronically Signed   By: David  [place]   On: 01/23/2015 07:24    ASSESSMENT / PLAN:  PULMONARY OETT 2/19 A: acute hypoxic respiratory Failure  Initially suspected L CAP, now less likely L pleural effusion, more likely due to myxedema than PNA, no infiltrate on post-thora CXR Iatrogenic L PTX P:   Goal extubate 2/26 Follow CXR Diuresis if he can tolerate from BP and renal perspectives, none ordered currently Will leave CT on water seal for another day, plan d/c after extubated  CARDIOVASCULAR CVL right IJ 2/19>>> A:  Shock, presumed Septic shock + hypothyroidism and bradycardia Bradycardia - myxedema coma Presumed adrenal insuff Mild troponin bump--> likely demand ischemia  mod PA htn Global reduction EF - myxedema likely cause P: Stress dose hydrocort for myxedema associated AI; synthroid (T4) 100 IV qd. I feel cardiac risk precludes T3 at this time Norepi off Plan to wean Dopamine to off 2/26 (HR now in 70's) No indication pacer. He is likely a poor candidate for same  RENAL A:   AKI (acute on chronic renal failure w/ BL sCr 1.5 to 1.6 range) - ATN sepsis, holding steady S Cr 2.6 Hematuria with clots, intermittent urinary catheter obstruction, improved H/o BPH Metabolic acidosis,  improved Mild hypomagnesemia P:   Follow BMP, not currently on diuretics Appreciate urology's help with foley and hematuria  GASTROINTESTINAL A:   Weight loss Protein calorie malnutrition ascites P:   Will need swallow eval when extubated  ppi  HEMATOLOGIC A:   Anemia of chronic disease w/out s/sx of bleeding  P:  D/c'd sq heparin 2/23 Trend cbc  INFECTIOUS A:   Apparent Left CAP Septic shock  Influenza A (although ? True clinical syndrome, RVP was positive) P:  BCx2 2/19>>> negative UC 2/19>>> negative Sputum 2/19>>> normal flora RVP 2/19>>> POSITIVE for Flu A  vanc 2/19>>>2/21 Zosyn 2/19>>>2/19 Cefepime 2/19>>> 2/23 azithro 2/19>>>2/21  Remains culture neg D/c cefepime 2/23 in absence of true LLL infiltrate (post-thoracentesis)  ENDOCRINE A:   Hypothyroidism with  mxyedma coma H/o DM  P:   Stress dose hydrocort Continue IV 100 mcg synthroid No T3, at this age with PAH and other associated valvular dz - high risk VT or cardiac decompensation  NEUROLOGIC A:   Acute Encephalopathy  H/o memory loss P:   RASS goal: 0 PAD protocol  Supportive care  D/c'd clonazepam on 2/21  FAMILY  - Updates: discussed care with pt's daughter at bedside on 2/25 and by phone on 2/26. We agree that he is no CPR or ACLS, but she would like to assess him following extubation before deciding whether he should be re-intubated if he fails extubation. If it was felt that he could recover then she would possibly consider reintubation   Independent CC time 45 minutes  Levy Pupa, MD, PhD 01/24/2015, 11:55 AM [facility] [phone] or if no answer [phone]

## 2015-01-24 NOTE — Progress Notes (Signed)
100cc iv fentanyl wasted in sink.  Witnessed by [place], Charity fundraiser.

## 2015-01-25 ENCOUNTER — Inpatient Hospital Stay (HOSPITAL_COMMUNITY): Payer: Medicare Other

## 2015-01-25 LAB — CBC
HCT: 22.2 % — ABNORMAL LOW (ref 39.0–52.0)
HEMOGLOBIN: 7.1 g/dL — AB (ref 13.0–17.0)
MCH: 24.5 pg — ABNORMAL LOW (ref 26.0–34.0)
MCHC: 32 g/dL (ref 30.0–36.0)
MCV: 76.6 fL — AB (ref 78.0–100.0)
PLATELETS: 111 10*3/uL — AB (ref 150–400)
RBC: 2.9 MIL/uL — ABNORMAL LOW (ref 4.22–5.81)
RDW: 23.1 % — ABNORMAL HIGH (ref 11.5–15.5)
WBC: 6.5 10*3/uL (ref 4.0–10.5)

## 2015-01-25 LAB — BASIC METABOLIC PANEL
ANION GAP: 6 (ref 5–15)
BUN: 145 mg/dL — ABNORMAL HIGH (ref 6–23)
CHLORIDE: 108 mmol/L (ref 96–112)
CO2: 22 mmol/L (ref 19–32)
CREATININE: 2.62 mg/dL — AB (ref 0.50–1.35)
Calcium: 8.1 mg/dL — ABNORMAL LOW (ref 8.4–10.5)
GFR calc Af Amer: 24 mL/min — ABNORMAL LOW (ref >90)
GFR calc non Af Amer: 20 mL/min — ABNORMAL LOW (ref >90)
Glucose, Bld: 87 mg/dL (ref 70–99)
POTASSIUM: 3.8 mmol/L (ref 3.5–5.1)
Sodium: 136 mmol/L (ref 135–145)

## 2015-01-25 LAB — GLUCOSE, CAPILLARY
GLUCOSE-CAPILLARY: 81 mg/dL (ref 70–99)
GLUCOSE-CAPILLARY: 91 mg/dL (ref 70–99)
GLUCOSE-CAPILLARY: 130 mg/dL — AB (ref 70–99)
Glucose-Capillary: 77 mg/dL (ref 70–99)
Glucose-Capillary: 105 mg/dL — ABNORMAL HIGH (ref 70–99)

## 2015-01-25 MED ORDER — SODIUM CHLORIDE 0.9 % IV SOLN
INTRAVENOUS | Status: AC
  Administered 2015-01-25: 1000 mL via INTRAVENOUS

## 2015-01-25 MED ORDER — DEXTROSE 5 % IV SOLN
1.0000 g | INTRAVENOUS | Status: DC
  Administered 2015-01-25: 1 g via INTRAVENOUS
  Filled 2015-01-25: qty 1

## 2015-01-25 MED ORDER — CETYLPYRIDINIUM CHLORIDE 0.05 % MT LIQD
7.0000 mL | Freq: Two times a day (BID) | OROMUCOSAL | Status: DC
  Administered 2015-01-25: 7 mL via OROMUCOSAL

## 2015-01-25 MED ORDER — VANCOMYCIN HCL IN DEXTROSE 750-5 MG/150ML-% IV SOLN
750.0000 mg | INTRAVENOUS | Status: DC
  Administered 2015-01-25: 750 mg via INTRAVENOUS
  Filled 2015-01-25: qty 150

## 2015-01-26 MED FILL — Medication: Qty: 1 | Status: AC

## 2015-01-27 ENCOUNTER — Telehealth: Payer: Self-pay

## 2015-01-27 NOTE — Telephone Encounter (Signed)
Received death certificate 0/45/99 from [facility] for Dr. Craige Cotta to sign.  Took to Dr. Craige Cotta for signature.  Called & spoke to [facility] for pick up.

## 2015-01-28 NOTE — Progress Notes (Signed)
PULMONARY / CRITICAL CARE MEDICINE   Name: Timothy Mayo MRN: [id number] DOB: January 25, 1925    ADMISSION DATE:  01/30/2015 CONSULTATION DATE:  2/19  REFERRING MD :  Gilmore Laroche  CHIEF COMPLAINT:  Acute respiratory failure septic shock   INITIAL PRESENTATION:  81 year old male admitted from home on 2/19 after being found w/ altered mental status and increased labored breathing. Admitted to ICU w/ working dx of acute respiratory failure in setting of L CAP w/ resultant septic shock/MODS. W/u revealed severe hypothyroidism and apparent myxedema coma  STUDIES:  CT head 2/19: NAD Echo 2023/01/31 45-50% global, pa pressure 65 mod pa htn  SIGNIFICANT EVENTS: 2/19: discussed prognosis w/ family. Made DNR in the event of cardiac arrest but otherwise aggressive care.  2023/01/31- TSH over 80, IV synthroid , steroids started 31-Jan-2023 renal US- ascites, med renal dz  SUBJECTIVE:  Playing with penis.  Mumbles.  VITAL SIGNS: Temp:  [95.4 F (35.2 C)-97 F (36.1 C)] 95.4 F (35.2 C) (02/27 0600) Pulse Rate:  [61-112] 105 (02/27 0500) Resp:  [10-26] 23 (02/27 0600) BP: (101-137)/(51-108) 103/79 mmHg (02/27 0600) SpO2:  [91 %-100 %] 100 % (02/27 0600) FiO2 (%):  [40 %] 40 % (02/26 1000) Weight:  [171 lb 1.2 oz (77.6 kg)] 171 lb 1.2 oz (77.6 kg) (02/27 0440) INTAKE / OUTPUT:  Intake/Output Summary (Last 24 hours) at 12/31/2014 0754 Last data filed at 01/21/2015 0600  Gross per 24 hour  Intake 382.03 ml  Output    765 ml  Net -382.97 ml    PHYSICAL EXAMINATION: General: thin Neuro: moves extremities HEENT: no sinus tenderness Cardiovascular: regular Lungs: no wheeze Abdomen: soft GU: severe penile and scrotal edema, foley in place but meatus difficulty to find Musculoskeletal:  Intact  Skin:  3 plus edema, pitting   LABS:  CBC  Recent Labs Lab 01/22/15 1953 01/23/15 0400 01/15/2015 0530  WBC 7.9 8.4 6.5  HGB 8.2* 9.6* 7.1*  HCT 24.7* 29.2* 22.2*  PLT 137* 64* 111*   BMET  Recent Labs Lab  01/23/15 0400 01/24/15 0417 01/14/2015 0530  NA 134* 134* 136  K 3.6 3.5 3.8  CL 102 104 108  CO2 BUN 123* 133* 145*  CREATININE 2.63* 2.60* 2.62*  GLUCOSE 135* 155* 87   Electrolytes  Recent Labs Lab 01/23/15 0400 01/24/15 0417 01/16/2015 0530  CALCIUM 8.2* 8.0* 8.1*   Sepsis Markers  Recent Labs Lab Jan 31, 2015 0945 01/19/15 0500 01/20/15 0435  PROCALCITON 1.34 2.92 2.47   Liver Enzymes  Recent Labs Lab 01/20/15 0435  AST 70*  ALT 57*  ALKPHOS 115  BILITOT 0.8  ALBUMIN 2.5*   Glucose  Recent Labs Lab 01/24/15 1609 01/24/15 1948 01/24/15 2020 01/24/15 2323 01/06/2015 0001 01/16/2015 0423  GLUCAP 79 62* 114* 66* 103* 77    Imaging Dg Chest Port 1 View  01/24/2015   CLINICAL DATA:  Acute respiratory failure.  EXAM: PORTABLE CHEST - 1 VIEW  COMPARISON:  01/23/2015.  FINDINGS: Tubes lines remain stable from yesterday's radiograph with a RIGHT IJ central venous line, endotracheal tube, nasogastric tube, and LEFT pigtail drainage catheter in the hemothorax. The heart remains enlarged. There is mild bibasilar subsegmental atelectasis. No definite pneumothorax.  IMPRESSION: Stable chest.   Electronically Signed   By: Davonna Belling M.D.   On: 01/24/2015 07:29    ASSESSMENT / PLAN:  PULMONARY OETT 2/19 >> 2/26 A:  Acute hypoxic respiratory failure. Lt pleural effusion and PTX. P:   D/c chest  tube 2/27 and f/u CXR  CARDIOVASCULAR CVL right IJ 2/19>>> A:  Shock, presumed Septic shock + hypothyroidism and bradycardia. Bradycardia - myxedema coma. Presumed adrenal insuff. Mild troponin bump--> likely demand ischemia. mod PA htn. Global reduction EF - myxedema likely cause. P: Continue solucortef   RENAL A:   AKI (acute on chronic renal failure w/ BL sCr 1.5 to 1.6 range) - ATN sepsis, holding steady S Cr 2.6. Hematuria with clots, intermittent urinary catheter obstruction, improved. H/o BPH. Metabolic acidosis, improved. Mild  hypomagnesemia. P:   Foley per urology Even fluid balance  GASTROINTESTINAL A:   Protein calorie malnutrition. Ascites. P:   Speech to assess swallowing PPI  HEMATOLOGIC A:   Anemia of critical illness. P:  F/u CBC SCD's  INFECTIOUS A:   Influenza A PNA. P:  Monitor clinically Can d/c droplet isolation  ENDOCRINE A:   Hypothyroidism with mxyedma coma. H/o DM. P:   Continue synthroid  NEUROLOGIC A:   Acute Encephalopathy 2nd to myxedema coma. H/o memory loss. P:   Monitor clinically  FAMILY  - Updates: discussed care with pt's daughter at bedside on 2/25 and by phone on 2/26. We agree that he is no CPR or ACLS, but she would like to assess him following extubation before deciding whether he should be re-intubated if he fails extubation. If it was felt that he could recover then she would possibly consider reintubation  Transfer to SDU.  Keep on PCCM service.  Coralyn Helling, MD [facility] 01/05/2015, 8:05 AM Pager:  [phone] After 3pm call: [phone]

## 2015-01-28 NOTE — Code Documentation (Cosign Needed)
  Patient Name: Timothy Mayo   MRN: [id number]   Date of Birth/ Sex: Apr 01, 1925 , male      Admission Date: 01-20-2015  Attending Provider: Alyson Reedy, MD  Primary Diagnosis: Altered mental status [R41.82] Acute respiratory failure with hypoxia [J96.01] History of ETT [Z92.89] Altered mental status, unspecified altered mental status type [R41.82]   Indication: Pt was in his usual state of health until this PM, when he was noted to be in asystole, then bradycardic on telemetry. Code blue was subsequently called. At the time of arrival on scene, ACLS protocol was underway.   Technical Description:  - CPR performance duration:  0  minute; patient was DNR, with intubation  - Was defibrillation or cardioversion used? No   - Was external pacer placed? No  - Was patient intubated pre/post CPR? Intubation was in process when patient lost pulse; intubation was then stopped (it was not yet completed when it was stopped)   Medications Administered: Y = Yes; Blank = No Amiodarone    Atropine    Calcium    Epinephrine    Lidocaine    Magnesium    Norepinephrine    Phenylephrine    Sodium bicarbonate    Vasopressin    Other    Post CPR evaluation:  - Final Status - Was patient successfully resuscitated ? No   Miscellaneous Information:  - Time of death:  8:10  PM  - Primary team notified?  Yes  - Family Notified? Yes   Supervising resident: Leonia Reeves, MD PGY3    Dionne Ano, MD   01/20/2015, 8:14 PM

## 2015-01-28 NOTE — Progress Notes (Signed)
eLink Physician-Brief Progress Note Patient Name: Timothy Mayo DOB: [birth year] MRN: [id number]   Date of Service  12/31/2014  HPI/Events of Note   No gram positive culture coverage for HCAP.    eICU Interventions   Vanc added per pharmacy.      Intervention Category Intermediate Interventions: OtherCurt Bears, Redith Drach R. 01/12/2015, 3:36 PM

## 2015-01-28 NOTE — Progress Notes (Signed)
SLP Cancellation Note  Patient Details Name: Timothy Mayo MRN: [id number] DOB: 07-31-1925     Cancelled treatment:         Received order for BSE, however pt not following commands today, significantly decreased awareness per RN. Will defer swallow assessment and return 2/28    Royce Macadamia 01/06/2015, 2:12 PM   Breck Coons [place] [phone]

## 2015-01-28 NOTE — Progress Notes (Signed)
   01/04/2015 2100  Clinical Encounter Type  Visited With Patient and family together;Health care provider  Visit Type Initial;Death  Referral From Nurse  Spiritual Encounters  Spiritual Needs Grief support  Stress Factors  Family Stress Factors Loss   Chaplain was paged to patient's room at 8:37 PM. Chaplain was notified that the patient passed away fairly suddenly this evening. Family was not present but had since made it to the hospital. Patient's family members were grieving at bedside when chaplain arrived. Family was supportive of each other during this time. Chaplain inquired about further support that he could provide. Patient's family did not have any support needs at this time. Chaplain did gather the family's funeral arrangement information and gave this information to the patient's nurse. Page Merrilyn Puma chaplain if patient's family needs further grief support this evening. Timothy Mayo, Timothy Mayo, Chaplain  9:18 PM

## 2015-01-28 NOTE — Progress Notes (Addendum)
ANTIBIOTIC CONSULT NOTE - INITIAL  Pharmacy Consult for Ceftazidime Indication: HCAP  No Known Allergies  Patient Measurements: Height:  (175.3 cm) Weight: 171 lb 1.2 oz (77.6 kg) IBW/kg (Calculated) : 70.7 Vital Signs: Temp: 95 F (35 C) (02/27 0900) Temp Source: Core (Comment) (02/27 0800) BP: 122/62 mmHg (02/27 1100) Pulse Rate: 105 (02/27 0500) Intake/Output from previous day: 02/26 0701 - 02/27 0700 In: 382 [I.V.:223.8; NG/GT:158.3] Out: 765 [Urine:765] Intake/Output from this shift: Total I/O In: 180 [I.V.:180] Out: -   Labs:  Recent Labs  01/22/15 1953 01/23/15 0400 01/24/15 0417 02-17-15 0530  WBC 7.9 8.4  --  6.5  HGB 8.2* 9.6*  --  7.1*  PLT 137* 64*  --  111*  CREATININE  --  2.63* 2.60* 2.62*   Estimated Creatinine Clearance: 19.9 mL/min (by C-G formula based on Cr of 2.62). No results for input(s): VANCOTROUGH, VANCOPEAK, VANCORANDOM, GENTTROUGH, GENTPEAK, GENTRANDOM, TOBRATROUGH, TOBRAPEAK, TOBRARND, AMIKACINPEAK, AMIKACINTROU, AMIKACIN in the last 72 hours.   Microbiology: Recent Results (from the past 720 hour(s))  Culture, blood (routine x 2)     Status: None   Collection Time: 01/22/2015 11:59 AM  Result Value Ref Range Status   Specimen Description BLOOD RIGHT FOREARM  Final   Special Requests BOTTLES DRAWN AEROBIC ONLY 5CC  Final   Culture   Final    NO GROWTH 5 DAYS Performed at [facility]    Report Status 01/23/2015 FINAL  Final  Culture, blood (routine x 2)     Status: None   Collection Time: 01/20/2015 12:02 PM  Result Value Ref Range Status   Specimen Description BLOOD LEFT FOREARM  Final   Special Requests BOTTLES DRAWN AEROBIC ONLY 3CC  Final   Culture   Final    NO GROWTH 5 DAYS Performed at [facility]    Report Status 01/23/2015 FINAL  Final  Culture, blood (routine x 2)     Status: None   Collection Time: 01/19/2015  1:27 PM  Result Value Ref Range Status   Specimen Description BLOOD CENTRAL LINE   Final   Special Requests BOTTLES DRAWN AEROBIC AND ANAEROBIC 10CC  Final   Culture   Final    NO GROWTH 5 DAYS Performed at [facility]    Report Status 01/23/2015 FINAL  Final  Culture, respiratory (NON-Expectorated)     Status: None   Collection Time: 12/31/2014  1:53 PM  Result Value Ref Range Status   Specimen Description TRACHEAL ASPIRATE  Final   Special Requests NONE  Final   Gram Stain   Final    MODERATE WBC PRESENT,BOTH PMN AND MONONUCLEAR RARE SQUAMOUS EPITHELIAL CELLS PRESENT RARE GRAM POSITIVE COCCI IN PAIRS Performed at [facility]    Culture   Final    Non-Pathogenic Oropharyngeal-type Flora Isolated. Performed at [facility]    Report Status 01/20/2015 FINAL  Final  MRSA PCR Screening     Status: None   Collection Time: 01/23/2015  4:12 PM  Result Value Ref Range Status   MRSA by PCR NEGATIVE NEGATIVE Final    Comment:        The GeneXpert MRSA Assay (FDA approved for NASAL specimens only), is one component of a comprehensive MRSA colonization surveillance program. It is not intended to diagnose MRSA infection nor to guide or monitor treatment for MRSA infections.   Culture, blood (x 2)     Status: None   Collection Time: 01/23/2015  4:30 PM  Result  Value Ref Range Status   Specimen Description BLOOD RIGHT HAND  Final   Special Requests BOTTLES DRAWN AEROBIC ONLY 3CC  Final   Culture   Final    NO GROWTH 5 DAYS Performed at [facility]    Report Status 01/23/2015 FINAL  Final  Urine culture     Status: None   Collection Time: 01/18/15 10:39 AM  Result Value Ref Range Status   Specimen Description URINE, CATHETERIZED  Final   Special Requests Normal  Final   Colony Count NO GROWTH Performed at [facility]   Final   Culture NO GROWTH Performed at [facility]   Final   Report Status 01/19/2015 FINAL  Final  Respiratory virus panel     Status: Abnormal   Collection Time: 01/18/15 12:13 PM   Result Value Ref Range Status   Source - RVPAN NASAL SWAB  Corrected   Respiratory Syncytial Virus A Negative Negative Final   Respiratory Syncytial Virus B Negative Negative Final   Influenza A Positive (A) Negative Final    Comment: Positive for Influenza A 2009 H1N1   Influenza B Negative Negative Final   Parainfluenza 1 Negative Negative Final   Parainfluenza 2 Negative Negative Final   Parainfluenza 3 Negative Negative Final   Metapneumovirus Negative Negative Final   Rhinovirus Negative Negative Final   Adenovirus Negative Negative Final    Comment: (NOTE) Performed At: [facility] [address] [place], [place] [id number] Mila Homer MD WU:[id number]   Body fluid culture     Status: None   Collection Time: 01/20/15 12:46 PM  Result Value Ref Range Status   Specimen Description FLUID LEFT PLEURAL  Final   Special Requests Normal  Final   Gram Stain   Final    NO WBC SEEN NO ORGANISMS SEEN Performed at [facility]    Culture   Final    NO GROWTH 3 DAYS Performed at [facility]    Report Status 01/24/2015 FINAL  Final    Medical History: Past Medical History  Diagnosis Date  . DIABETES MELLITUS, TYPE II 12/08/2006  . DUODENAL ULCER 12/08/2006  . THORACOTOMY, HX OF 02/27/1994    Medications:  Scheduled:  . antiseptic oral rinse  7 mL Mouth Rinse BID  . hydrocortisone sodium succinate  50 mg Intravenous Q6H  . insulin aspart  0-9 Units Subcutaneous 6 times per day  . levothyroxine  100 mcg Intravenous Daily  . pantoprazole (PROTONIX) IV  40 mg Intravenous Q24H  . sodium chloride  10-40 mL Intracatheter Q12H   Assessment: 81 yo m admitted on 2/19 for AMS, acute respiratory failure and septic shock. Started on Vanc and Cefepime. Abx discontinued 2/26 due to cultures negative. Repeat CXR should new RUL infiltrate. Pharmacy consulted to start [facility] for HCAP. WBC 6.5, patient hypothermic at 95.4. Stating 96 -100% on 2L [place].  Goal of  Therapy:  Eradication of infection  Plan:  Begin Ceftazidime 1g q24h F/u any additional gram + coverage Monitor renal function and clinical progress  Thank you for allowing pharmacy to be part of this patient's care team  Sterling Ucci M. Jaquille Kau, Pharm.D Clinical Pharmacy Resident Pager: [phone] 01-31-2015 .2:00 PM  ________________ Pharmacy consulted to add Vancomycin for gram positive coverage. SCr 2.62, CrCl~20. UOP 0.4.  Plan: Vanc  q24h Monitor renal function closely for dose adjustment Monitor clinical progress  VT at steady state if clinically warranted  Thank you for allowing pharmacy to be part  of this patient's care team  Andris Brothers M. Hartley Urton, Pharm.D Clinical Pharmacy Resident Pager: [phone] 02-Feb-2015 .4:06 PM

## 2015-01-28 NOTE — Progress Notes (Signed)
PT Cancellation Note  Patient Details Name: Timothy Mayo MRN: [id number] DOB: [birth year]   Cancelled Treatment:    Reason Eval/Treat Not Completed: Medical issues which prohibited therapy (per nursing, hold therapy).   Fabio Asa 02/10/2015, 10:37 AM Charlotte Crumb, PT DPT  [phone]

## 2015-01-28 NOTE — Progress Notes (Signed)
Hypoglycemic Event  CBG: 66  Treatment: D50 IV 25 mL  Symptoms: None  Follow-up CBG: Time:0000 CBG Result:103  Possible Reasons for Event: Inadequate meal intake  Comments/MD notified:MD notified of recurrent low CBG's. Orders placed for D5-NS, orders completed. Will continue to monitor patient.     Tangy Drozdowski, [place] L  Remember to initiate Hypoglycemia Order Set & complete

## 2015-01-28 NOTE — Progress Notes (Signed)
eLink Physician-Brief Progress Note Patient Name: Zaiven Osterhoudt DOB: 29-Jun-1925 MRN: [id number]   Date of Service  02/14/15  HPI/Events of Note   Oliguric   eICU Interventions   IVF 500 x 1      Intervention Category Intermediate Interventions: Oliguria - evaluation and management  Beatriz Quintela R. 14-Feb-2015, 7:32 PM

## 2015-01-28 NOTE — Progress Notes (Signed)
Hypoglycemic Event  CBG: 62  Treatment: D50 IV 25 mL  Symptoms: None  Follow-up CBG: Time:2015 CBG Result:114  Possible Reasons for Event: Inadequate meal intake  Comments/MD notified:Protocol followed. Will continue to monitor patient.     Ashton Belote, [place] L  Remember to initiate Hypoglycemia Order Set & complete

## 2015-01-28 NOTE — Progress Notes (Signed)
CXR shows new RUL infiltrate.  Will add fortaz per pharmacy.  Coralyn Helling, MD [facility] Pulmonary/Critical Care 01/21/2015, 1:06 PM Pager:  [phone] After 3pm call: [phone]

## 2015-01-28 NOTE — Progress Notes (Signed)
At 2000 I entered pt's room to assess pt and obtain CBG.  I left room after obtaining CBG 130 to obtain 1 unit Insulin.  While in Med room I was notified by charge nurse that pt was noted to be in asystole on monitor. I returned immediately to the room where code blue was called at 2002  with pt pulseless and noted to be in [place].  53/34  BP   Pt being manually bagged with no respirations noted.   Code status was limited with intubation only. RT in process of intubating pt when Pt again was noted to be in Asystole at 2005.    No pulse , no respirations.  See CPR code sheet for further details      Pronounced dead at 2010 by Dr Curt Bears, CCM  who notified family.   Family arrived at 2030, Sunday Corn arrived at2045 and left at 2110.  [employer] assisttng pt.   [employer] notified at 2023.  Spoke with Barbette Or.  Referal Number [id number]  POC  Daughter Yohannes [place] :[phone] Family remains at bedside.  Emotional support and drinks given to family 2225.  Family remain at bedside awaiting further family members.  Grief support given. 2259  Family departed.  Body to be prepared for  [facility].  Hand off report to [place], [place]

## 2015-01-28 NOTE — Progress Notes (Signed)
L chest tube pulled by Stanton Kidney, RN as ordered. Upon removal, moderate amount of serous fluid began to spurt from the site during respiratory movements. Pressure held, Dr Craige Cotta notified. Per Dr Craige Cotta, pressure dressing placed and pt placed on R side. Pt tolerated procedure well, sats remain 100%. BS auscultated bilaterally after procedure and they were unchanged.

## 2015-01-28 NOTE — Progress Notes (Signed)
eLink Physician-Brief Progress Note Patient Name: Evangelos Du DOB: 01/16/1925 MRN: [id number]   Date of Service  01/10/2015  HPI/Events of Note   Called into room 2/2 respiratory arrest. Patient pulseless. Intubation process stopped as patient without chance of recovery given cardiac arrest as well.     eICU Interventions   Patient's daughter, Luetta Nutting, informed. [phone].      Intervention Category Minor Interventions: Routine modifications to care plan (e.g. PRN medications for pain, fever)  Edynn Gillock R. 01/10/2015, 8:17 PM

## 2015-01-28 DEATH — deceased

## 2015-01-30 NOTE — Discharge Summary (Signed)
Timothy Mayo was a 81 y.o. male admitted on 01-22-2015 with altered mental status, bradycardia, and hypothermia.  He required intubation.  He was started on pressors for septic shock and broad spectrum antibiotics.  He was made DNR at families request.  Cardiology was consulted.  He was found to have large left pleural effusion.  He had thoracentesis and then had chest tube placed for pneumothorax.  He was found to have TSH over 80.  He was started on steroids and synthroid.  His respiratory improved and he was extubated.  He did not however have much improvement in his mental status.  He had chest tube removed.  Follow up chest xray showed developing rt lung infiltrate.  He was started back on antibiotics.  He developed progressive shortness of breath and then developed cardiac arrest.  D/w family and DNR status confirmed.  He subsequently expired on 01/24/2015.  Final Diagnoses: Acute metabolic encephalopathy Myxedema coma Influenza A pneumonia HCAP Left pleural effusion Left pneumothorax Acute respiratory failure Septic shock HCAP Acute on chronic systolic heart failure Acute on chronic diastolic heart failure Atrial fibrillation Stage 3 CKD Bradycardia Ascites  Coralyn Helling, MD [facility] Pulmonary/Critical Care 01/30/2015, 8:58 AM

## 2016-01-30 IMAGING — CT CT HEAD W/O CM
2 series · 16 of 30 positions shown, 20 images · non-contrast
Comparison: None.

CLINICAL DATA: Confused, disoriented, right-sided weakness

EXAM:
CT HEAD WITHOUT CONTRAST
TECHNIQUE: Contiguous axial images were obtained from the base of the skull
through the vertex without intravenous contrast.

[Series 201: head w/o, idose (1) · axial · non-contrast · 0.43mm/px · z∈[+107,+237]mm · 13 of 32 slices shown, 17 images]
[im 3/32  brain]
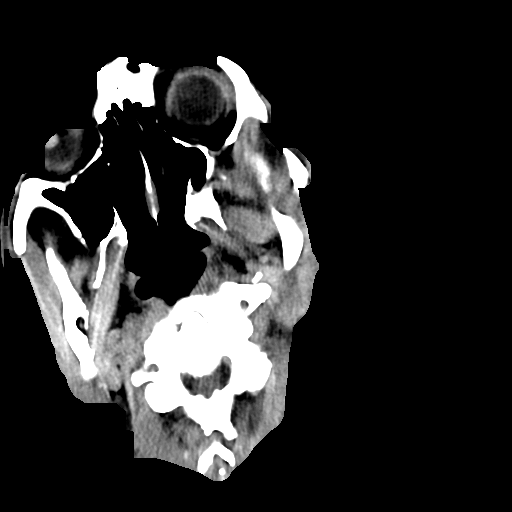
[im 3/32  bone]
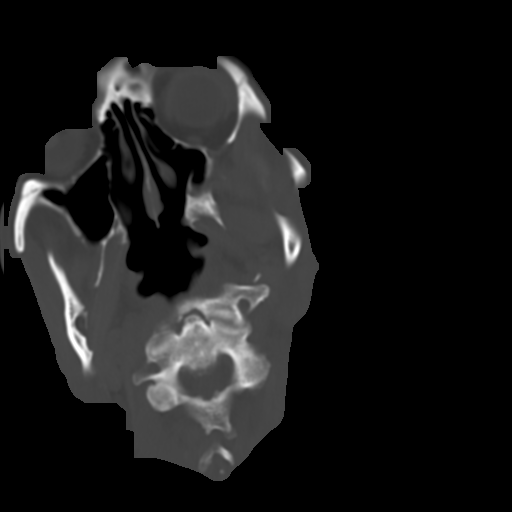
[im 5/32  brain]
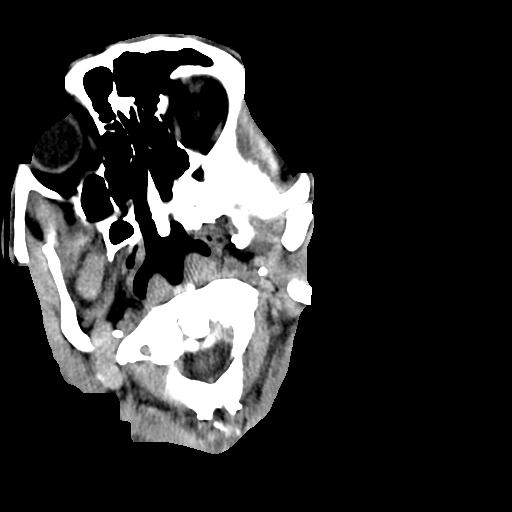
[im 7/32  brain]
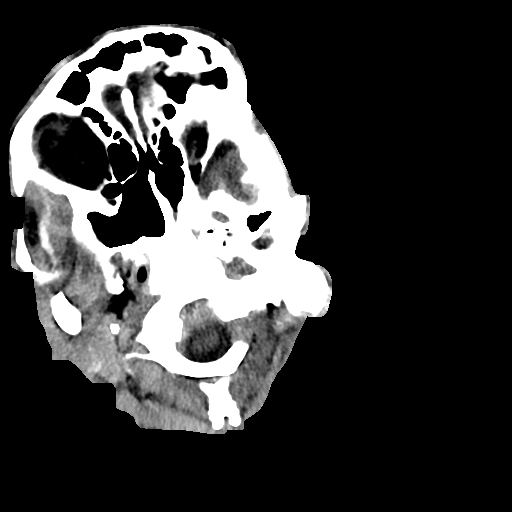
[im 9/32  brain]
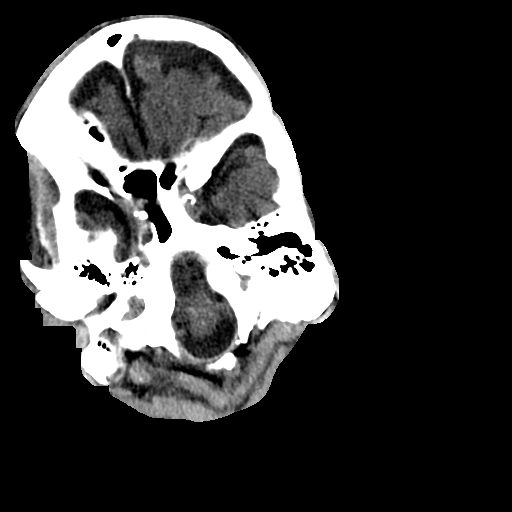
[im 12/32  brain]
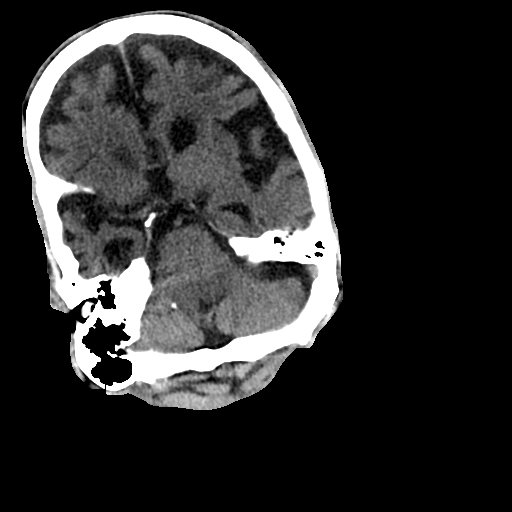
[im 12/32  bone]
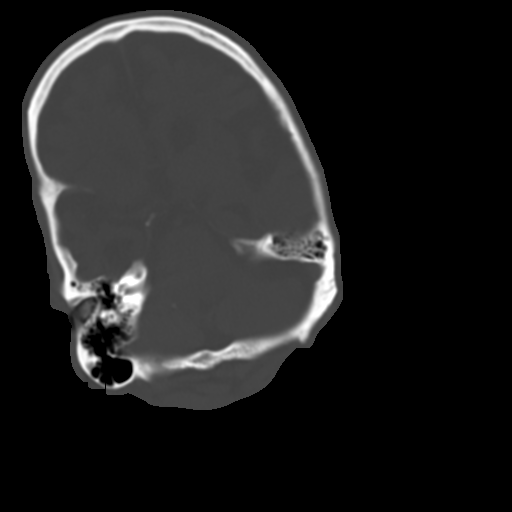
[im 14/32  brain]
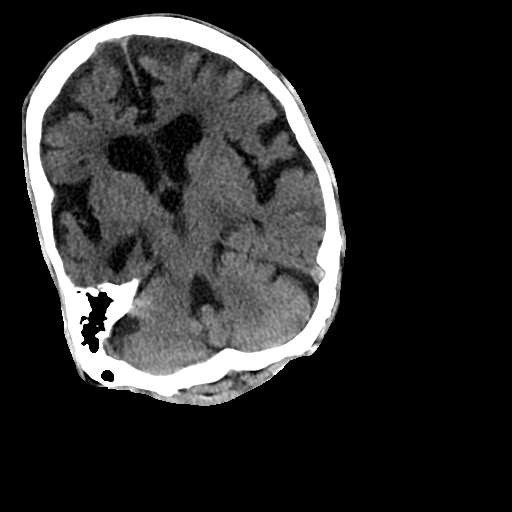
[im 16/32  brain]
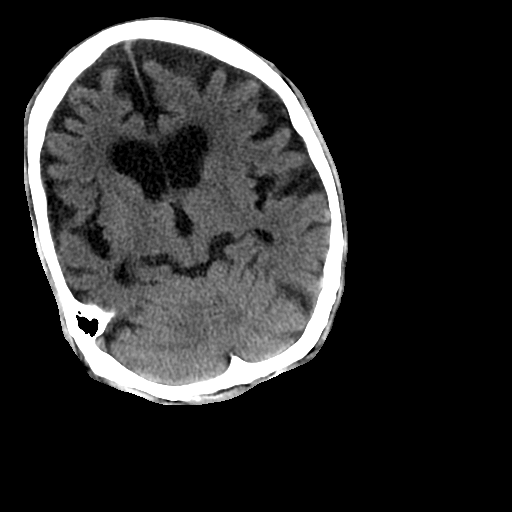
[im 18/32  brain]
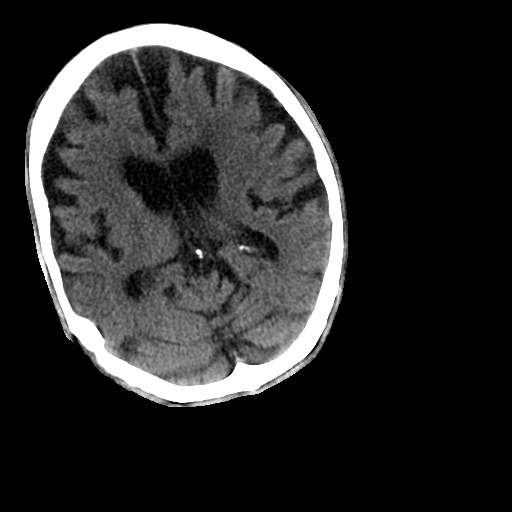
[im 20/32  brain]
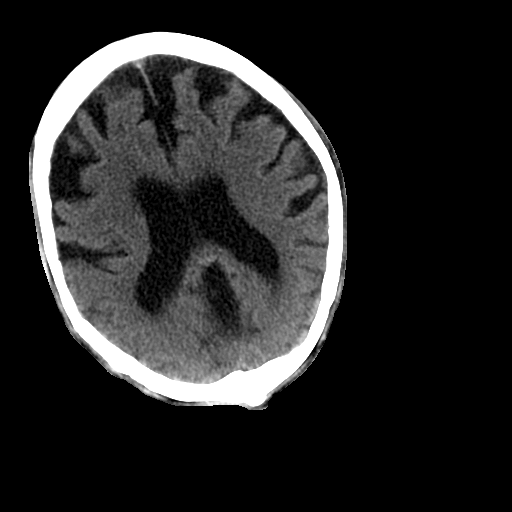
[im 20/32  bone]
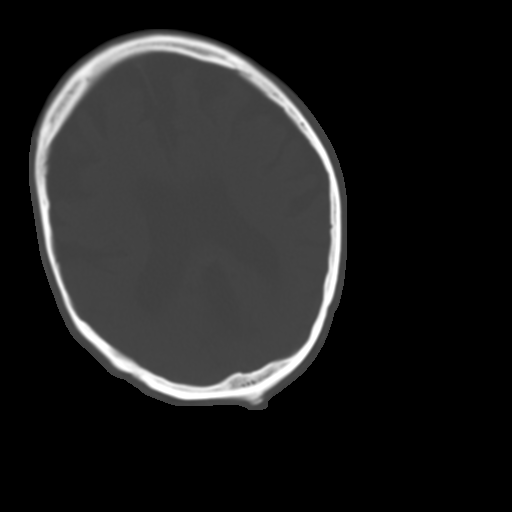
[im 23/32  brain]
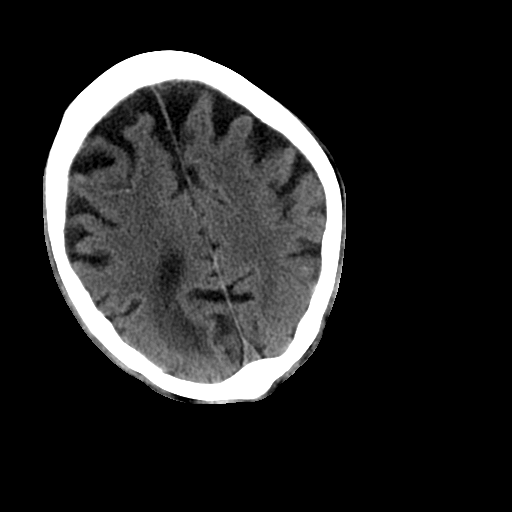
[im 25/32  brain]
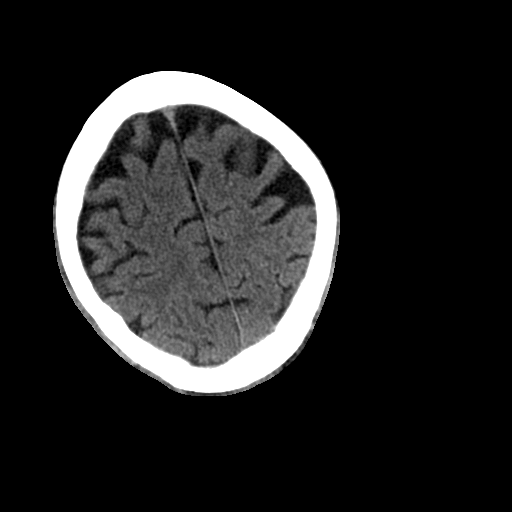
[im 27/32  brain]
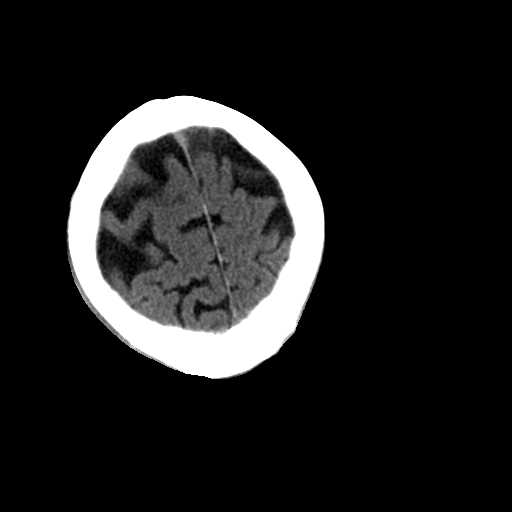
[im 29/32  brain]
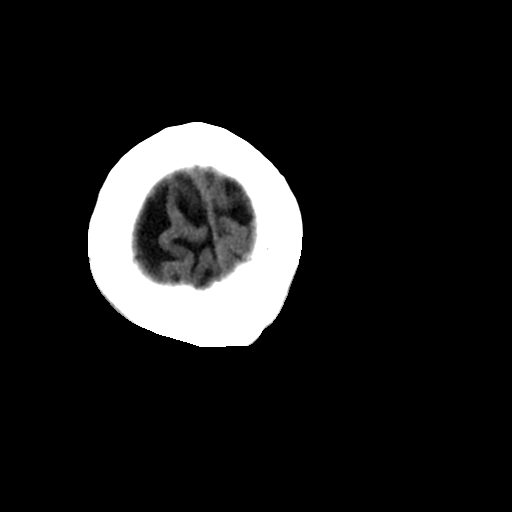
[im 29/32  bone]
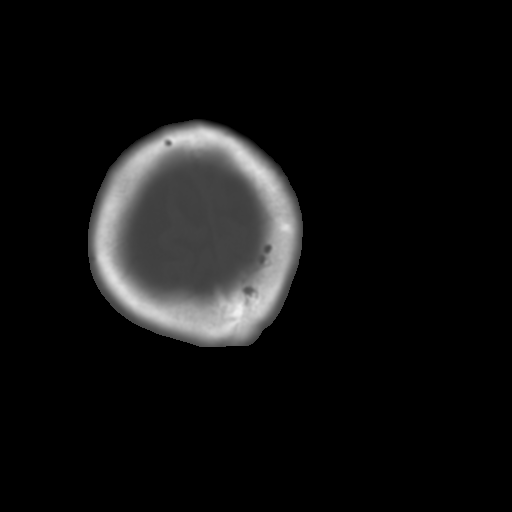

[Series 202: head w/o bone, idose (1) · axial · non-contrast · 0.43mm/px · z∈[+107,+152]mm · 3 of 32 slices shown]
[im 3/32  bone]
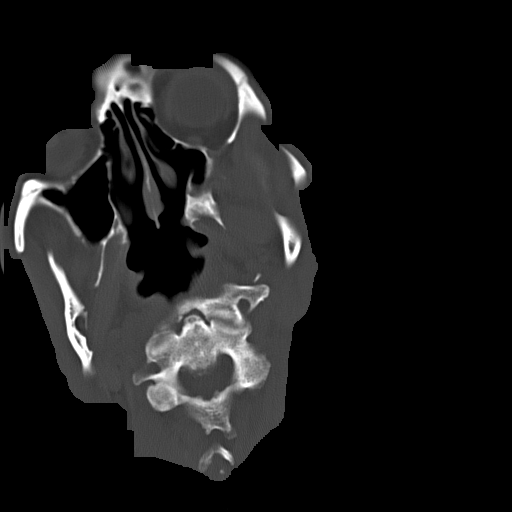
[im 7/32  bone]
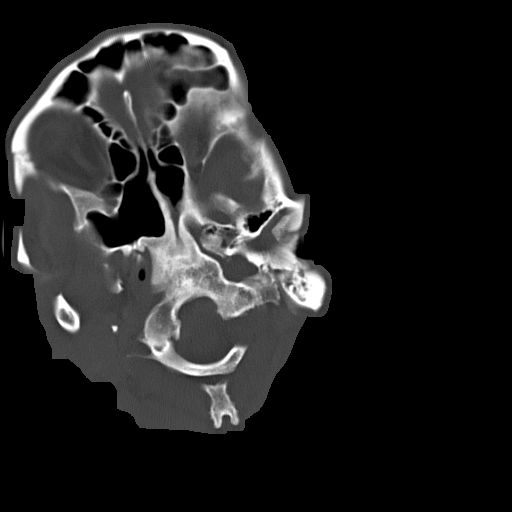
[im 12/32  bone]
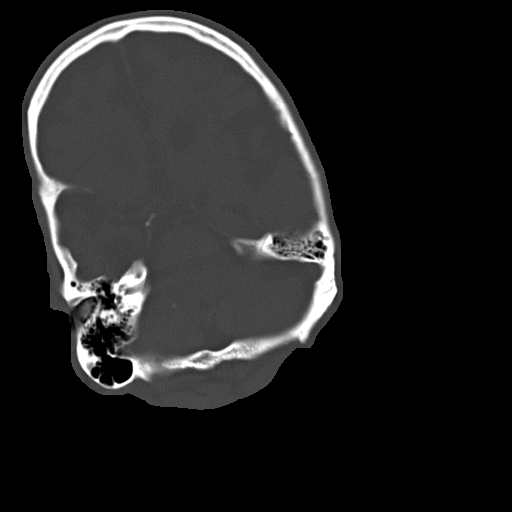

[16 of 30 positions shown; findings below may reference images not displayed]

FINDINGS: No evidence of parenchymal hemorrhage or extra-axial fluid
collection. No mass lesion, mass effect, or midline shift.

No CT evidence of acute infarction.

Subcortical white matter and periventricular small vessel ischemic
changes. Intracranial atherosclerosis.

Global cortical atrophy.  Secondary ventricular prominence.

The visualized paranasal sinuses are essentially clear. The mastoid
air cells are unopacified.

No evidence of calvarial fracture.
IMPRESSION: No evidence of acute intracranial abnormality.

Atrophy with secondary ventriculomegaly.

Small vessel ischemic changes with intracranial atherosclerosis.
